# Patient Record
Sex: Male | Born: 1970 | Race: White | Hispanic: No | Marital: Single | Smoking: Never smoker
Health system: Southern US, Community
[De-identification: ages and names within clinical notes are randomized; demographics above are authoritative.]

## PROBLEM LIST (undated history)

## (undated) DIAGNOSIS — R569 Unspecified convulsions: Secondary | ICD-10-CM

## (undated) HISTORY — PX: NECK SURGERY: SHX720

## (undated) HISTORY — PX: CHEST SURGERY: SHX595

## (undated) HISTORY — PX: CERVICAL SPINE SURGERY: SHX589

---

## 2001-03-10 ENCOUNTER — Emergency Department (HOSPITAL_COMMUNITY): Admission: EM | Admit: 2001-03-10 | Discharge: 2001-03-10 | Payer: Self-pay | Admitting: Emergency Medicine

## 2004-07-13 ENCOUNTER — Encounter: Admission: RE | Admit: 2004-07-13 | Discharge: 2004-07-13 | Payer: Self-pay | Admitting: Family Medicine

## 2004-11-18 ENCOUNTER — Inpatient Hospital Stay (HOSPITAL_COMMUNITY): Admission: EM | Admit: 2004-11-18 | Discharge: 2004-11-24 | Payer: Self-pay | Admitting: Emergency Medicine

## 2004-11-21 ENCOUNTER — Encounter (INDEPENDENT_AMBULATORY_CARE_PROVIDER_SITE_OTHER): Payer: Self-pay | Admitting: Cardiology

## 2004-11-22 ENCOUNTER — Encounter (INDEPENDENT_AMBULATORY_CARE_PROVIDER_SITE_OTHER): Payer: Self-pay | Admitting: *Deleted

## 2006-02-06 ENCOUNTER — Ambulatory Visit: Payer: Self-pay | Admitting: Oncology

## 2006-02-06 LAB — CBC WITH DIFFERENTIAL/PLATELET
Basophils Absolute: 0 10*3/uL (ref 0.0–0.1)
HCT: 30.5 % — ABNORMAL LOW (ref 38.7–49.9)
HGB: 9.4 g/dL — ABNORMAL LOW (ref 13.0–17.1)
LYMPH%: 14.1 % (ref 14.0–48.0)
MCH: 22.4 pg — ABNORMAL LOW (ref 28.0–33.4)
MCHC: 31 g/dL — ABNORMAL LOW (ref 32.0–35.9)
MCV: 72.1 fL — ABNORMAL LOW (ref 81.6–98.0)
MONO%: 9.4 % (ref 0.0–13.0)
NEUT%: 74.3 % (ref 40.0–75.0)
lymph#: 0.4 10*3/uL — ABNORMAL LOW (ref 0.9–3.3)

## 2006-02-06 LAB — CHCC SMEAR

## 2006-02-08 LAB — IRON AND TIBC
TIBC: 352 ug/dL (ref 215–435)
UIBC: 331 ug/dL

## 2006-02-08 LAB — FERRITIN: Ferritin: 45 ng/mL (ref 22–322)

## 2006-02-15 LAB — HEMOGLOBINOPATHY EVALUATION: Hgb S Quant: 0 % (ref 0.0–0.0)

## 2006-04-11 ENCOUNTER — Ambulatory Visit: Payer: Self-pay | Admitting: Oncology

## 2006-07-06 ENCOUNTER — Ambulatory Visit: Payer: Self-pay | Admitting: Oncology

## 2006-07-16 ENCOUNTER — Ambulatory Visit (HOSPITAL_COMMUNITY): Admission: RE | Admit: 2006-07-16 | Discharge: 2006-07-16 | Payer: Self-pay | Admitting: Family Medicine

## 2006-07-26 LAB — IRON AND TIBC
%SAT: 16 % — ABNORMAL LOW (ref 20–55)
Iron: 51 ug/dL (ref 42–165)
TIBC: 313 ug/dL (ref 215–435)
UIBC: 262 ug/dL

## 2006-07-26 LAB — CBC WITH DIFFERENTIAL/PLATELET
BASO%: 1.1 % (ref 0.0–2.0)
Basophils Absolute: 0 10*3/uL (ref 0.0–0.1)
EOS%: 0.9 % (ref 0.0–7.0)
Eosinophils Absolute: 0 10*3/uL (ref 0.0–0.5)
HCT: 28.1 % — ABNORMAL LOW (ref 38.7–49.9)
HGB: 8.9 g/dL — ABNORMAL LOW (ref 13.0–17.1)
LYMPH%: 16.4 % (ref 14.0–48.0)
MCH: 23.7 pg — ABNORMAL LOW (ref 28.0–33.4)
MCHC: 31.5 g/dL — ABNORMAL LOW (ref 32.0–35.9)
MCV: 75.3 fL — ABNORMAL LOW (ref 81.6–98.0)
MONO#: 0.3 10*3/uL (ref 0.1–0.9)
MONO%: 8.4 % (ref 0.0–13.0)
NEUT#: 2.5 10*3/uL (ref 1.5–6.5)
NEUT%: 73.2 % (ref 40.0–75.0)
Platelets: 381 10*3/uL (ref 145–400)
RBC: 3.73 10*6/uL — ABNORMAL LOW (ref 4.20–5.71)
RDW: 18 % — ABNORMAL HIGH (ref 11.2–14.6)
WBC: 3.5 10*3/uL — ABNORMAL LOW (ref 4.0–10.0)
lymph#: 0.6 10*3/uL — ABNORMAL LOW (ref 0.9–3.3)

## 2006-07-26 LAB — FERRITIN: Ferritin: 104 ng/mL (ref 22–322)

## 2006-07-26 LAB — COMPREHENSIVE METABOLIC PANEL
ALT: 12 U/L (ref 0–53)
AST: 14 U/L (ref 0–37)
Albumin: 3.9 g/dL (ref 3.5–5.2)
Alkaline Phosphatase: 42 U/L (ref 39–117)
BUN: 9 mg/dL (ref 6–23)
CO2: 28 mEq/L (ref 19–32)
Calcium: 8.5 mg/dL (ref 8.4–10.5)
Chloride: 106 mEq/L (ref 96–112)
Creatinine, Ser: 0.83 mg/dL (ref 0.40–1.50)
Glucose, Bld: 102 mg/dL — ABNORMAL HIGH (ref 70–99)
Potassium: 4.4 mEq/L (ref 3.5–5.3)
Sodium: 141 mEq/L (ref 135–145)
Total Bilirubin: 0.3 mg/dL (ref 0.3–1.2)
Total Protein: 5.6 g/dL — ABNORMAL LOW (ref 6.0–8.3)

## 2006-07-26 LAB — HAPTOGLOBIN: Haptoglobin: 59 mg/dL (ref 16–200)

## 2006-07-26 LAB — RETICULOCYTES
IRF: 0.48 — ABNORMAL HIGH (ref 0.070–0.380)
RETIC #: 155.5 10*3/uL — ABNORMAL HIGH (ref 31.8–103.9)

## 2006-07-26 LAB — LACTATE DEHYDROGENASE: LDH: 116 U/L (ref 94–250)

## 2006-09-13 ENCOUNTER — Ambulatory Visit: Payer: Self-pay | Admitting: Oncology

## 2006-09-18 LAB — COMPREHENSIVE METABOLIC PANEL
ALT: 12 U/L (ref 0–53)
AST: 14 U/L (ref 0–37)
Albumin: 4.6 g/dL (ref 3.5–5.2)
Alkaline Phosphatase: 52 U/L (ref 39–117)
BUN: 9 mg/dL (ref 6–23)
Creatinine, Ser: 0.84 mg/dL (ref 0.40–1.50)
Potassium: 4.1 mEq/L (ref 3.5–5.3)

## 2006-09-18 LAB — CBC WITH DIFFERENTIAL/PLATELET
BASO%: 0.5 % (ref 0.0–2.0)
EOS%: 0.6 % (ref 0.0–7.0)
HCT: 32.4 % — ABNORMAL LOW (ref 38.7–49.9)
MCH: 23.9 pg — ABNORMAL LOW (ref 28.0–33.4)
MCHC: 31.6 g/dL — ABNORMAL LOW (ref 32.0–35.9)
NEUT%: 77.1 % — ABNORMAL HIGH (ref 40.0–75.0)
RBC: 4.29 10*6/uL (ref 4.20–5.71)
WBC: 4.2 10*3/uL (ref 4.0–10.0)
lymph#: 0.6 10*3/uL — ABNORMAL LOW (ref 0.9–3.3)

## 2006-09-18 LAB — CHCC SMEAR

## 2006-11-19 ENCOUNTER — Ambulatory Visit: Payer: Self-pay | Admitting: Oncology

## 2006-11-21 LAB — CBC WITH DIFFERENTIAL/PLATELET
Eosinophils Absolute: 0 10*3/uL (ref 0.0–0.5)
HCT: 28.9 % — ABNORMAL LOW (ref 38.7–49.9)
LYMPH%: 15.2 % (ref 14.0–48.0)
MONO#: 0.3 10*3/uL (ref 0.1–0.9)
NEUT#: 2.5 10*3/uL (ref 1.5–6.5)
NEUT%: 75.2 % — ABNORMAL HIGH (ref 40.0–75.0)
Platelets: 397 10*3/uL (ref 145–400)
WBC: 3.3 10*3/uL — ABNORMAL LOW (ref 4.0–10.0)

## 2006-11-23 LAB — COMPREHENSIVE METABOLIC PANEL
ALT: 12 U/L (ref 0–53)
AST: 13 U/L (ref 0–37)
BUN: 10 mg/dL (ref 6–23)
Creatinine, Ser: 0.93 mg/dL (ref 0.40–1.50)
Total Bilirubin: 0.4 mg/dL (ref 0.3–1.2)

## 2006-11-23 LAB — HEMOGLOBINOPATHY EVALUATION
Hgb A2 Quant: 2.9 % (ref 2.2–3.2)
Hgb F Quant: 0 % (ref 0.0–2.0)
Hgb S Quant: 0 % (ref 0.0–0.0)

## 2006-11-23 LAB — FERRITIN: Ferritin: 236 ng/mL (ref 22–322)

## 2007-03-04 ENCOUNTER — Ambulatory Visit: Payer: Self-pay | Admitting: Oncology

## 2007-07-26 ENCOUNTER — Ambulatory Visit (HOSPITAL_COMMUNITY): Admission: RE | Admit: 2007-07-26 | Discharge: 2007-07-26 | Payer: Self-pay | Admitting: General Surgery

## 2007-08-01 ENCOUNTER — Encounter (HOSPITAL_COMMUNITY): Admission: RE | Admit: 2007-08-01 | Discharge: 2007-08-05 | Payer: Self-pay | Admitting: General Surgery

## 2007-08-05 ENCOUNTER — Ambulatory Visit (HOSPITAL_COMMUNITY): Admission: RE | Admit: 2007-08-05 | Discharge: 2007-08-05 | Payer: Self-pay | Admitting: General Surgery

## 2007-08-05 ENCOUNTER — Encounter (HOSPITAL_BASED_OUTPATIENT_CLINIC_OR_DEPARTMENT_OTHER): Payer: Self-pay | Admitting: General Surgery

## 2010-08-02 ENCOUNTER — Other Ambulatory Visit: Payer: Self-pay | Admitting: Family Medicine

## 2010-08-02 ENCOUNTER — Ambulatory Visit
Admission: RE | Admit: 2010-08-02 | Discharge: 2010-08-02 | Disposition: A | Discharge status: Home / Self Care | Payer: PRIVATE HEALTH INSURANCE | Source: Ambulatory Visit | Attending: Family Medicine | Admitting: Family Medicine

## 2010-08-02 DIAGNOSIS — M1250 Traumatic arthropathy, unspecified site: Secondary | ICD-10-CM

## 2010-10-31 ENCOUNTER — Encounter (HOSPITAL_COMMUNITY)
Admission: RE | Admit: 2010-10-31 | Discharge: 2010-10-31 | Disposition: A | Discharge status: Home / Self Care | Payer: PRIVATE HEALTH INSURANCE | Source: Ambulatory Visit | Attending: Neurosurgery | Admitting: Neurosurgery

## 2010-10-31 LAB — BASIC METABOLIC PANEL
BUN: 10 mg/dL (ref 6–23)
CO2: 33 mEq/L — ABNORMAL HIGH (ref 19–32)
Calcium: 9.8 mg/dL (ref 8.4–10.5)
Chloride: 100 mEq/L (ref 96–112)
Creatinine, Ser: 0.77 mg/dL (ref 0.4–1.5)
GFR calc Af Amer: >60 mL/min (ref >60)

## 2010-10-31 LAB — SURGICAL PCR SCREEN
MRSA, PCR: NEGATIVE
Staphylococcus aureus: NEGATIVE

## 2010-10-31 LAB — CBC
MCH: 30.3 pg (ref 26.0–34.0)
MCHC: 35.9 g/dL (ref 30.0–36.0)
MCV: 84.4 fL (ref 78.0–100.0)
Platelets: 218 10*3/uL (ref 150–400)

## 2010-11-01 NOTE — Op Note (Signed)
NAME:  [name] [name], [name] [name]               ACCOUNT NO.:  [id number]   MEDICAL RECORD NO.:  [id number]          PATIENT TYPE:  AMB   LOCATION:  SDS                          FACILITY:  [facility]   PHYSICIAN:  Leonie Man, M.D.   DATE OF BIRTH:  04-16-1971   DATE OF PROCEDURE:  08/05/2007  DATE OF DISCHARGE:  08/05/2007                               OPERATIVE REPORT   PREOPERATIVE DIAGNOSIS:  Hemorrhoidal disease.   POSTOPERATIVE DIAGNOSIS:  Hemorrhoidal disease.   PROCEDURE:  Hemorrhoidectomy.   SURGEON:  Leonie Man, M.D.   ASSISTANT:  OR tech.   ANESTHESIA:  General.   [name] is a 40 year old man with persistent bleeding and painful  hemorrhoidal disease both with internal and external hemorrhoids.  He  has been treated the past with injection therapy without much effect and  currently none of the over-the-counter medications has continued to help  him.  On evaluation there are no fissures or fistulas noted.  He comes  to the operating room now for hemorrhoidectomy after risks and potential  benefits of surgery had been discussed.  All questions answered and  consent obtained.   PROCEDURE:  The patient is positioned in the prone position on the  operating room table and the buttocks spread apart and held with cloth  tape.  The perianal region is prepped and draped to be included in a  sterile operative field.  The anal verge is dilated up to three  fingerbreadths after which the operating speculum was placed within the  anus.  On the left lateral aspect there is a very large hemorrhoid  hemorrhoidal complex which is infiltrated with 0.5% Marcaine with  epinephrine.  The entire hemorrhoidal complex was dissected off of the  underlying sphincter muscles using electrocautery and maintaining  hemostasis throughout the entire course of dissection.  The defect is  then closed with running suture of 2-0 chromic catgut suture.  Similarly, on the right side the right anterior  hemorrhoidal complex was  similarly treated by cautery excision off of the sphincter muscles and  closure with a running 2-0 chromic catgut.  A solitary midline  hemorrhoid was then suture ligated with 3-0 chromic.  The hemorrhoid was  then amputated.  A small skin tag on the anterior aspect of the anus was  excised.  All areas of dissection then checked for hemostasis and noted  to be dry.  I placed Marcaine soaked Gelfoam sponges over all of the  incisions and placed a sterile dressing over the anus.  The anesthetic  was reversed and the patient removed from the operating room to the  recovery room in stable condition.  He tolerated the procedure well.      Leonie Man, M.D.  Electronically Signed    PB/MEDQ  D:  08/05/2007  T:  08/06/2007  Job:  [id number]

## 2010-11-04 NOTE — H&P (Signed)
NAME:  [name] [name], [name] [name]               ACCOUNT NO.:  [id number]   MEDICAL RECORD NO.:  [id number]          PATIENT TYPE:  INP   LOCATION:  1823                         FACILITY:  [facility]   PHYSICIAN:  Jonna L. Robb Matar, M.D.DATE OF BIRTH:  [birth year]   DATE OF ADMISSION:  11/18/2004  DATE OF DISCHARGE:                                HISTORY & PHYSICAL   PRIMARY CARE PHYSICIAN:  Renaye Rakers, M.D.   CHIEF COMPLAINT:  Abnormal lab test.   This is a 40 year old Caucasian male who has noticed he has been tired for a  couple of weeks but surprisingly has not really been dyspneic on exertion,  not orthostatic, has had no peripheral edema, no chest pain.  He came in for  a physical and was found to be extremely anemic and so is being admitted for  a workup.   PAST MEDICAL HISTORY:  Epilepsy.  He has had this for a number of years.  The last seizure he had was two years ago.  He has been on Tegretol for at  least ten years as well as Neurontin.  He is followed by [facility].   OPERATIONS:  Surgery at age 59 to fix a pectus excavatum.   FAMILY HISTORY:  Thyroid disease.  His father had a MI.  His mother had  anemia.   REVIEW OF SYSTEMS:  He has had a hemorrhoid, otherwise as noted.  All other  review of systems were asked and were negative.   PHYSICAL EXAMINATION:  VITAL SIGNS:  Blood pressure is 134/70, pulse is 102  lying down, respirations are 18.  He is afebrile.  SKIN:  Very pale.  Skin shows no rash, lesions, or nodules.  HEENT:  Conjunctivae and lids are otherwise normal.  Pupils reactive.  Extraocular movements are full.  He has normal hearing.  Mucosa and pharynx:  There is no thyromegaly or carotid bruits.  LUNGS:  Respiratory effort is normal.  The lungs are clear to A&P without  wheezing, rales, rhonchi, or dullness.  HEART:  Slightly tachycardic.  Normal S1 and S2.  There is a 2/6  holosystolic murmur in the left sternal border.  Pulses show no bruits.  There is no  cyanosis, clubbing, or edema.  ABDOMEN:  Nontender.  Normal bowel sounds.  No hepatosplenomegaly.  No  hernia.  No adenopathy.  MUSCULOSKELETAL:  Muscle strength is 5/5 with a full range of motion all  four extremities.  NEUROLOGIC:  Cranial nerves are intact.  DTRs are 2+.  Station is normal.  He is alert and oriented x 3.  Normal memory, judgment, and affect.   LABORATORY DATA:  Available at present shows a hemoglobin of 4.3, white  count is 4.0, platelet count 465.  His CMP is negative.  His TSH is 3.55.   IMPRESSION:  1.  Severe anemia.  Differential diagnosis is wide open.  He could have some      B-12 or folate deficiency.  He has a family history of thyroid problems.      His Tegretol especially has been known to interfere with bone marrow in  multiple areas and has been associated with aplastic anemia.  He could      have an occult gastrointestinal bleed.  We are going to do an extensive      workup, transfuse him probably about 5-6 units, and if there is no      obvious source, we will consult with hematology to get bone marrow      biopsy.  2.  Epilepsy.  For the present, I will leave him on his Tegretol and      Neurontin.  Obviously if Tegretol is implicated in his anemia, he will      need to find an alternative anticonvulsant.  3.  Systolic murmur.  I am going to get an echocardiogram on him.      Apparently, his Mom states he has had this for a number of years but she      does not remember if it has ever evaluated.  It could just be really      loud at the moment because he is so extremely anemic but we will      evaluate that as well.      JLB/MEDQ  D:  11/18/2004  T:  11/19/2004  Job:  [id number]

## 2010-11-04 NOTE — Op Note (Signed)
NAME:  [name] [name], BRILL NO.:  [id number]   MEDICAL RECORD NO.:  [id number]          PATIENT TYPE:  INP   LOCATION:  3016                         FACILITY:  [facility]   PHYSICIAN:  John C. Madilyn Fireman, M.D.    DATE OF BIRTH:  [birth year]   DATE OF PROCEDURE:  11/22/2004  DATE OF DISCHARGE:                                 OPERATIVE REPORT   INDICATIONS FOR PROCEDURE:  Severe anemia with hemoglobin less than 5.0,  with severe microcytosis and documented iron deficiency.   PROCEDURE:  The patient was placed in the left lateral decubitus position  and placed on the pulse monitor, with continuous low-flow oxygen delivered  by nasal cannula. He was sedated with 75 mcg IV fentanyl and 5 mg IV Versed.  The Olympus video endoscope was advanced under direct vision into the  oropharynx and esophagus.  The esophagus was straight and of normal caliber,  with squamocolumnar line at  38 cm.  There was no visible hiatal hernia  ring, stricture or other abnormalities at the GE junction. Stomach was  entered and a small amount of liquid secretions were suctioned from the  fundus. Retroflexed view of the cardia was unremarkable.  The fundus, body,  antrum and pylorus all appeared normal. The duodenum was entered and both  the bulb and second portion were well  inspected and appeared to be within  normal limits. The scope was advanced as far as possible down the distal  duodenum,  and biopsies were taken to rule out celiac disease.  The  scope  was then withdrawn and the patient prepared for colonoscopy.  He tolerated  the procedure well. There were no immediate complications.   IMPRESSION:  Normal study.   PLAN:  Proceed to colonoscopy and await small bowel biopsies.      JCH/MEDQ  D:  11/22/2004  T:  11/22/2004  Job:  [id number]   cc:   Renaye Rakers, M.D.  [phone]  [place]  [place] [address]  Fax: [phone]

## 2010-11-04 NOTE — Consult Note (Signed)
NAME:  [name] [name], GILL NO.:  [id number]   MEDICAL RECORD NO.:  [id number]          PATIENT TYPE:  INP   LOCATION:  3016                         FACILITY:  [facility]   PHYSICIAN:  John C. Madilyn Fireman, M.D.    DATE OF BIRTH:  [birth year]   DATE OF CONSULTATION:  11/20/2004  DATE OF DISCHARGE:                                   CONSULTATION   REASON FOR CONSULTATION:  Severe anemia.   HISTORY OF PRESENT ILLNESS:  The patient is a 40 year old white male with a  history of epilepsy who presented with weakness and fatigue and was found to  have a hemoglobin of 4.0 with a  hematocrit of 17.3, platelet count of  473,000, white blood cell count  4300 with normal BUN and an MCV of less  than 60.  He does when questioned, admit some intermittent rectal bleeding  but has not told his parents this. It sounds more like a degree of bleeding  consistent with hemorrhoids by his description.  He denies any nausea,  vomiting, anorexia, diarrhea, significant abdominal pain or weight loss.  He  has never been known to be anemic in the past.   PAST MEDICAL HISTORY:  Seizure disorder.   PAST SURGICAL HISTORY:  Surgery to repair pectus excavatum at age 60.   FAMILY HISTORY:  Father had a myocardial infarction.  No family history of  GI malignancy or hematologic disorders.   PHYSICAL EXAMINATION:  GENERAL:  Well-developed, well-nourished white male  in no acute distress, quite pale despite transfusion of four units packed  red blood cells.  HEENT:  Unremarkable.  HEART:  Regular rate and rhythm, without murmur.  LUNGS:  Clear.  ABDOMEN:  Soft, nondistended with normoactive bowel sounds.  No  hepatosplenomegaly, masses, guarding.   IMPRESSION:  Severe iron deficiency anemia.  No documentation of hemoccult  status yet but would assume occult gastrointestinal blood loss until  evidence otherwise.   PLAN:  Patient is scheduled for colonoscopy and esophagogastroduodenoscopy  with biopsy which  will be done in the next two days.      JCH/MEDQ  D:  11/20/2004  T:  11/21/2004  Job:  [id number]   cc:   Renaye Rakers, M.D.  [phone] [address], [address]  [place]  [place] [address]  Fax: [phone]

## 2010-11-04 NOTE — Discharge Summary (Signed)
[name], [name] NO.:  [id number]   MEDICAL RECORD NO.:  [id number]          PATIENT TYPE:  INP   LOCATION:  3016                         FACILITY:  [facility]   PHYSICIAN:  Gertha Calkin, M.D.DATE OF BIRTH:  Jul 03, 1970   DATE OF ADMISSION:  11/18/2004  DATE OF DISCHARGE:  11/24/2004                                 DISCHARGE SUMMARY   PRIMARY CARE PHYSICIAN:  Renaye Rakers, M.D.   CONSULTATIONS:  Petra Kuba, M.D.   DISCHARGE DIAGNOSES:  1.  Microcytic anemia.  2.  Seizure disorder.   DISCHARGE MEDICATIONS:  Patient is to resume home doses of the following:  1.  Neurontin 800 mg p.o. t.i.d.  2.  Tegretol 600 mg p.o. b.i.d.  New medications:  1.  Ferrous sulfate 325 mg p.o. daily.   DIAGNOSTIC STUDIES:  1.  Esophagogastroduodenoscopy and colonoscopy negative. (See consultative      operative notes for details.)  2.  Duodenal mucosal biopsy negative pathology for findings consistent with      dysplasia, metaplasia or villous atrophy (not consistent with celiac      Spr[place]).   HOSPITAL COURSE:  Please see history and physical for details of admission.   PROBLEM #1:  MICROCYTIC ANEMIA:  The patient had evaluation by  gastroenterology, has had intermittent Heme positive stools and microcytic  anemia.  No gross gastrointestinal bleed or findings consistent with  gastrointestinal blood loss was elicited through the interventional  procedures.  Biopsy results as above.  Patient still has intermittent Heme  positive stools on discharge, however, is hemodynamically stable and  hemoglobin has been stable after initial 4 unit transfusions from 6 to  approximately in the 9.0 range.  The day of discharge the hemoglobin is 9.4.  MCV throughout has been in the 60's  Hemoglobin electrophoresis has been  drawn and sent, however those results are pending at the time of discharge.  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  At this time follow up  with primary care physician needs to determine whether he has a genetic  disorder that would explain his microcytic anemia.  He has been empirically  started on iron supplementation therapy.  Note:  Reticulocyte count obtained  during this hospitalization showed a value of 1.4. This was within the  normal range (however, this is not to be expected if he did have normal bone  marrow function).  Would repeat retic count since he is on iron therapy now  to determine whether supplementation has enabled his bone marrow to produce  erythrocytes.  If electrophoresis does not provide the answer, would  determine whether a repeat retic count showed normal bone marrow function.  Since he is stable, I am sending him home with careful instructions to  return to the emergency department if he has sudden onset of light  headedness or worsening gross bloody stools.  Otherwise, if abnormal bone  marrow function is the issue  would obtain a hematology consult in the  outpatient setting.   PROBLEM #2:  SEIZURE DISORDER:  No events during this hospitalization.  As  noted above, did not hold his Tegretol and continue this at discharge.  There may be a consideration as a maneuver to hold his Tegretol if he can be  controlled on an alternative seizure medication.   DISPOSITION:  Patient is stable on discharge home with normal vital signs,  normal labs, stable hemoglobin as noted above.   FOLLOW UP:  Follow up with Dr. Parke Simmers.  He is instructed to make an  appointment early next week between Monday and Wednesday to follow up  hemoglobin electrophoresis and possibly getting a repeat reticulocyte count  as he has now been started on iron replacement therapy.  It should be noted  that it may be prudent to refer him to outpatient hematology to determine  the underlying cause  of his microcytic anemia as clinical .       JD/MEDQ  D:  11/24/2004  T:  11/24/2004  Job:  [id number]   cc:   Renaye Rakers, M.D.  [phone], [address]  [place]  [place] [address]  Fax: [phone]   Petra Kuba, M.D.  [address], [address]  [place]  [place] [address]  Fax: [phone]

## 2010-11-04 NOTE — Op Note (Signed)
NAME:  [name] [name], [name] [name] NO.:  [id number]   MEDICAL RECORD NO.:  [id number]          PATIENT TYPE:  INP   LOCATION:  3016                         FACILITY:  [facility]   PHYSICIAN:  John C. Madilyn Fireman, M.D.    DATE OF BIRTH:  05/07/1971   DATE OF PROCEDURE:  11/22/2004  DATE OF DISCHARGE:                                 OPERATIVE REPORT   PROCEDURE:  Colonoscopy.   INDICATIONS FOR PROCEDURE:  Severe microcytic anemia.   DESCRIPTION OF PROCEDURE:  The patient was placed in the left lateral  decubitus position and placed on pulse monitor, with continuous low-flow  oxygen delivered by nasal cannula. He was sedated with 25 mcg IV fentanyl  and 2.5 mg IV Versed, in addition to that given in the previous EGD. The  Olympus video colonoscope was inserted into the rectum and advanced to the  cecum, confirmed by transillumination of McBurney's point and visualization  of ileocecal valve and appendiceal orifice. The prep was excellent. The  cecum, ascending, transverse, descending and sigmoid colon all appeared  normal; with no masses, polyps, diverticula or other mucosal abnormalities,  The rectum likewise appeared normal, and retroflexed view of the anus  revealed no obvious internal hemorrhoids. The scope was then withdrawn and  the patient returned to the recovery room in stable condition. He tolerated  the procedure well. There were no immediate complications.   IMPRESSION:  Otherwise normal colonoscopy.   PLAN:  Await small bowel biopsies.   RESULTS:  Negative with probably hematology consult.      JCH/MEDQ  D:  11/22/2004  T:  11/22/2004  Job:  [id number]   cc:   Renaye Rakers, M.D.  [phone] [address]  [place]  [place] [address]  Fax: [phone]

## 2010-11-08 ENCOUNTER — Observation Stay (HOSPITAL_COMMUNITY): Payer: PRIVATE HEALTH INSURANCE

## 2010-11-08 ENCOUNTER — Observation Stay (HOSPITAL_COMMUNITY)
Admission: RE | Admit: 2010-11-08 | Discharge: 2010-11-09 | Disposition: A | Discharge status: Home / Self Care | Payer: PRIVATE HEALTH INSURANCE | Source: Ambulatory Visit | Attending: Neurosurgery | Admitting: Neurosurgery

## 2010-11-08 DIAGNOSIS — M4712 Other spondylosis with myelopathy, cervical region: Secondary | ICD-10-CM | POA: Insufficient documentation

## 2010-11-08 DIAGNOSIS — Z79899 Other long term (current) drug therapy: Secondary | ICD-10-CM | POA: Insufficient documentation

## 2010-11-08 DIAGNOSIS — G40802 Other epilepsy, not intractable, without status epilepticus: Secondary | ICD-10-CM | POA: Insufficient documentation

## 2010-11-08 DIAGNOSIS — M5000 Cervical disc disorder with myelopathy, unspecified cervical region: Principal | ICD-10-CM | POA: Insufficient documentation

## 2010-11-11 NOTE — Op Note (Signed)
NAME:  [name] [name], [name] [name] NO.:  [id number]  MEDICAL RECORD NO.:  [id number]           PATIENT TYPE:  O  LOCATION:  3533                         FACILITY:  [facility]  PHYSICIAN:  Danae Orleans. Venetia Maxon, M.D.  DATE OF BIRTH:  19-Oct-1970  DATE OF PROCEDURE:  11/08/2010 DATE OF DISCHARGE:                              OPERATIVE REPORT   PREOPERATIVE DIAGNOSES:  Herniated cervical disk with spondylosis, stenosis, myelopathy with cervical radiculopathy, C5-C6 and C6-C7 levels.  POSTOPERATIVE DIAGNOSES:  Herniated cervical disk with spondylosis, stenosis, myelopathy with cervical radiculopathy, C5-C6 and C6-C7 levels.  PROCEDURE:  Anterior cervical decompression and fusion, C5-C6 and C6-C7 levels with PEEK interbody cages with morselized bone autograft and allograft, PureGen, and anterior cervical plate.  SURGEON:  Danae Orleans. Venetia Maxon, MD  ASSISTANT:  Clydene Fake, MD  ANESTHESIA:  General endotracheal anesthesia.  ESTIMATED BLOOD LOSS:  Minimal.  COMPLICATIONS:  None.  DISPOSITION:  Recovery.  INDICATIONS:  [name] [name] is a 40 year old young man with seizure disorder and left arm weakness and myelopathy with severe spinal cord compression and canal stenosis at C5-C6 and C6-C7 levels.  It was elected take him to surgery for anterior cervical decompression and fusion at these affected levels.  PROCEDURE IN DETAIL:  [name] [name] was brought to the operating room. Following satisfactory and uncomplicated induction of general endotracheal anesthesia and placement of intravenous lines, he was placed in supine position on the operating table.  His neck was maintained in neutral alignment and the anterior neck was then prepped and draped in the usual sterile fashion.  The area of planned incision was infiltrated with local lidocaine.  Incision was made from midline to the anterior border of sternocleidomastoid muscle in one of the middle neck creases and carried through  platysma layer.  Subplatysmal dissection was performed exposing the anterior cervical spine, keeping the carotid sheath lateral, trachea and esophagus kept medial.  Bent spinal needles were placed what were felt to be the C5-C6 and C6-C7 levels and this was confirmed on intraoperative x-ray.  Longus colli muscles were then taken down from the anterior cervical spine with the electrocautery.  Key elevator and self-retaining Shadow-Line retractor were placed to facilitate exposure.  The interspaces at C5-C6 and C6-C7 were then incised.  Disk material was removed in piecemeal fashion. Distraction pins were placed at C5 and C6 and using gentle distraction the interspace was opened.  The endplates were eburnated with high-speed drill and uncinate spurs and central spurs were also drilled down with a 2-mm matchstick drill.  The central and left-sided disk herniation was then removed.  The spinal cord dura was decompressed.  There was a calcified ligamentous tissue which appeared to be on its way to densely adhering to the spinal cord dura which was carefully elevated and then removed.  Spinal cord dura and nerve roots were well decompressed. Hemostasis was assured.  Attention was then turned to the C6-C7 level where distraction pin was placed at C7 and using gentle interspace distraction again the interspace was opened.  The herniated disk material was removed and both C7 nerve roots were widely decompressed and extended  out to the neural foramina and hemostasis was assured.  The central spinal cord dura was also decompressed.  After trial sizing, it was elected to use a 5-mm PEEK interbody cage which was packed with PureGen soaked Profuse blocks and autograft removed from the bone spurs and drilling of the endplates.  The implant was then tamped into position and countersunk appropriately.  Distraction pin was removed at C7 and the screw hole was packed with Gelfoam.  A similarly sized implant  was placed at C5-C6 and this was tamped into position.  The distraction pins were removed, sites were packed, and subsequently a 32- mm Trestle anterior cervical plate was affixed to the anterior cervical spine after removing traction weight with 14-mm variable angle screws two at C5, two at C6, and two at C7, all screws had excellent purchase. Locking mechanisms were engaged.  Wound was irrigated.  Soft tissues were inspected and found to be in good repair.  Hemostasis was assured. Lateral radiograph demonstrated well-positioned interbody grafts and anterior cervical plate without any complicating features.  The platysma layer was then closed with 3-0 Vicryl sutures.  Skin edges were approximated with 3-0 Vicryl subcuticular stitch.  Wound was dressed with Dermabond.  The patient was extubated in the operating room and taken to recovery in stable and satisfactory condition having tolerated the operation well.  Counts were correct at the end of the case.     Danae Orleans. Venetia Maxon, M.D.     JDS/MEDQ  D:  11/08/2010  T:  11/09/2010  Job:  [id number]  Electronically Signed by Maeola Harman M.D. on 11/11/2010 09:19:23 AM

## 2011-03-10 LAB — COMPREHENSIVE METABOLIC PANEL
Albumin: 4
Alkaline Phosphatase: 52
BUN: 7
CO2: 30
Chloride: 105
Glucose, Bld: 104 — ABNORMAL HIGH
Potassium: 4.8
Total Bilirubin: 0.3

## 2011-03-10 LAB — CBC
HCT: 31.7 — ABNORMAL LOW
HCT: 27.4 — ABNORMAL LOW
Hemoglobin: 8.6 — ABNORMAL LOW
Platelets: 234
RBC: 4.46
RBC: 4.23
WBC: 4.7
WBC: 5.3

## 2011-03-10 LAB — DIFFERENTIAL
Basophils Absolute: 0
Basophils Relative: 1
Eosinophils Absolute: 0
Lymphocytes Relative: 12
Monocytes Absolute: 0.5
Monocytes Relative: 11
Neutrophils Relative %: 82 — ABNORMAL HIGH

## 2011-03-10 LAB — CROSSMATCH
ABO/RH(D): A POS
Antibody Screen: NEGATIVE

## 2011-04-13 ENCOUNTER — Emergency Department (HOSPITAL_COMMUNITY)
Admission: EM | Admit: 2011-04-13 | Discharge: 2011-04-13 | Disposition: A | Discharge status: Home / Self Care | Payer: Worker's Compensation | Attending: Emergency Medicine | Admitting: Emergency Medicine

## 2011-04-13 DIAGNOSIS — Z79899 Other long term (current) drug therapy: Secondary | ICD-10-CM | POA: Insufficient documentation

## 2011-04-13 DIAGNOSIS — W268XXA Contact with other sharp object(s), not elsewhere classified, initial encounter: Secondary | ICD-10-CM | POA: Insufficient documentation

## 2011-04-13 DIAGNOSIS — G40909 Epilepsy, unspecified, not intractable, without status epilepticus: Secondary | ICD-10-CM | POA: Insufficient documentation

## 2011-04-13 DIAGNOSIS — Y990 Civilian activity done for income or pay: Secondary | ICD-10-CM | POA: Insufficient documentation

## 2011-04-13 DIAGNOSIS — S01409A Unspecified open wound of unspecified cheek and temporomandibular area, initial encounter: Secondary | ICD-10-CM | POA: Insufficient documentation

## 2011-04-18 ENCOUNTER — Emergency Department (HOSPITAL_COMMUNITY)
Admission: EM | Admit: 2011-04-18 | Discharge: 2011-04-18 | Disposition: A | Discharge status: Home / Self Care | Payer: PRIVATE HEALTH INSURANCE | Attending: Emergency Medicine | Admitting: Emergency Medicine

## 2011-04-18 DIAGNOSIS — Z09 Encounter for follow-up examination after completed treatment for conditions other than malignant neoplasm: Secondary | ICD-10-CM | POA: Insufficient documentation

## 2012-09-14 ENCOUNTER — Emergency Department (HOSPITAL_COMMUNITY)
Admission: EM | Admit: 2012-09-14 | Discharge: 2012-09-14 | Disposition: A | Discharge status: Home / Self Care | Payer: PRIVATE HEALTH INSURANCE | Attending: Emergency Medicine | Admitting: Emergency Medicine

## 2012-09-14 ENCOUNTER — Encounter (HOSPITAL_COMMUNITY): Payer: Self-pay | Admitting: Emergency Medicine

## 2012-09-14 DIAGNOSIS — R197 Diarrhea, unspecified: Secondary | ICD-10-CM | POA: Insufficient documentation

## 2012-09-14 DIAGNOSIS — A084 Viral intestinal infection, unspecified: Secondary | ICD-10-CM

## 2012-09-14 DIAGNOSIS — R509 Fever, unspecified: Secondary | ICD-10-CM | POA: Insufficient documentation

## 2012-09-14 DIAGNOSIS — G40909 Epilepsy, unspecified, not intractable, without status epilepticus: Secondary | ICD-10-CM | POA: Insufficient documentation

## 2012-09-14 DIAGNOSIS — R1084 Generalized abdominal pain: Secondary | ICD-10-CM | POA: Insufficient documentation

## 2012-09-14 DIAGNOSIS — A088 Other specified intestinal infections: Secondary | ICD-10-CM | POA: Insufficient documentation

## 2012-09-14 DIAGNOSIS — Z79899 Other long term (current) drug therapy: Secondary | ICD-10-CM | POA: Insufficient documentation

## 2012-09-14 HISTORY — DX: Unspecified convulsions: R56.9

## 2012-09-14 LAB — CBC WITH DIFFERENTIAL/PLATELET
Basophils Absolute: 0 10*3/uL (ref 0.0–0.1)
Basophils Relative: 0 % (ref 0–1)
Eosinophils Absolute: 0 10*3/uL (ref 0.0–0.7)
Eosinophils Relative: 0 % (ref 0–5)
Lymphocytes Relative: 2 % — ABNORMAL LOW (ref 12–46)
MCV: 81 fL (ref 78.0–100.0)
Platelets: 179 10*3/uL (ref 150–400)
RDW: 12.3 % (ref 11.5–15.5)
WBC: 12.3 10*3/uL — ABNORMAL HIGH (ref 4.0–10.5)

## 2012-09-14 LAB — URINALYSIS, MICROSCOPIC ONLY
Glucose, UA: NEGATIVE mg/dL
Hgb urine dipstick: NEGATIVE
Specific Gravity, Urine: 1.028 (ref 1.005–1.030)
pH: 5 (ref 5.0–8.0)

## 2012-09-14 LAB — COMPREHENSIVE METABOLIC PANEL
ALT: 26 U/L (ref 0–53)
AST: 27 U/L (ref 0–37)
Albumin: 4.6 g/dL (ref 3.5–5.2)
CO2: 26 mEq/L (ref 19–32)
Calcium: 9.4 mg/dL (ref 8.4–10.5)
Sodium: 139 mEq/L (ref 135–145)
Total Protein: 8 g/dL (ref 6.0–8.3)

## 2012-09-14 MED ORDER — SODIUM CHLORIDE 0.9 % IV SOLN
1000.0000 mL | Freq: Once | INTRAVENOUS | Status: AC
  Administered 2012-09-14: 1000 mL via INTRAVENOUS

## 2012-09-14 MED ORDER — MORPHINE SULFATE 4 MG/ML IJ SOLN
4.0000 mg | Freq: Once | INTRAMUSCULAR | Status: AC
  Administered 2012-09-14: 4 mg via INTRAVENOUS
  Filled 2012-09-14: qty 1

## 2012-09-14 MED ORDER — ONDANSETRON HCL 4 MG/2ML IJ SOLN
4.0000 mg | Freq: Once | INTRAMUSCULAR | Status: AC
  Administered 2012-09-14: 4 mg via INTRAVENOUS
  Filled 2012-09-14: qty 2

## 2012-09-14 MED ORDER — HYDROCODONE-ACETAMINOPHEN 5-325 MG PO TABS: 2.0000 | ORAL_TABLET | ORAL | Status: DC | PRN

## 2012-09-14 MED ORDER — ONDANSETRON 4 MG PO TBDP: ORAL_TABLET | ORAL | Status: DC

## 2012-09-14 MED ORDER — ACETAMINOPHEN 325 MG PO TABS
650.0000 mg | ORAL_TABLET | Freq: Once | ORAL | Status: AC
  Administered 2012-09-14: 650 mg via ORAL
  Filled 2012-09-14: qty 2

## 2012-09-14 NOTE — ED Provider Notes (Signed)
History     CSN: [id number]  Arrival date & time 09/14/12  4098   First MD Initiated Contact with Patient 09/14/12 0745      Chief Complaint  Patient presents with  . Nausea  . Emesis  . Diarrhea    (Consider location/radiation/quality/duration/timing/severity/associated sxs/prior treatment) Patient is a 42 y.o. male presenting with vomiting and diarrhea. The history is provided by the patient and medical records. No language interpreter was used.  Emesis Severity:  Moderate Duration:  7 hours Timing:  Intermittent Emesis appearance: NBNB. Progression:  Unchanged Chronicity:  New Recent urination:  Normal Context: not post-tussive and not self-induced   Relieved by:  Nothing Worsened by:  Nothing tried Ineffective treatments:  None tried Associated symptoms: abdominal pain and diarrhea   Diarrhea Associated symptoms: abdominal pain and vomiting   Associated symptoms: no diaphoresis and no fever     [name] [name] is a 42 y.o. male  with a hx of seizure disorder presents to the Emergency Department complaining of gradual, persistent, progressively worsening nausea vomiting and diarrhea onset 1 AM this morning.  Patient states his father, who is 2, has the same symptoms and has been hospitalized with likely the Norovirus.  Patient states symptoms began with nausea, progressed to vomiting and diarrhea and his abdominal pain developed afterwards. He describes his pain as soreness and cramping worse on the left than the right, rated at a 5/10 and nonradiating. Associated symptoms include low-grade fever.  Nothing makes it better and nothing makes it worse.  Pt denies chills, headache, neck pain, chest pain, shortness of breath, dysuria, hematuria, weakness, dizziness, syncope, seizures.     Past Medical History  Diagnosis Date  . Seizures     Past Surgical History  Procedure Laterality Date  . Neck surgery    . Chest surgery      No family history on file.  History   Substance Use Topics  . Smoking status: Never Smoker   . Smokeless tobacco: Not on file  . Alcohol Use: No      Review of Systems  Constitutional: Negative for fever, diaphoresis, appetite change, fatigue and unexpected weight change.  HENT: Negative for mouth sores, trouble swallowing, neck pain and neck stiffness.   Respiratory: Negative for cough, chest tightness, shortness of breath, wheezing and stridor.   Cardiovascular: Negative for chest pain and palpitations.  Gastrointestinal: Positive for nausea, vomiting, abdominal pain and diarrhea. Negative for constipation, blood in stool, abdominal distention and rectal pain.  Genitourinary: Negative for dysuria, urgency, frequency, hematuria, flank pain and difficulty urinating.  Musculoskeletal: Negative for back pain.  Skin: Negative for rash.  Neurological: Negative for weakness.  Hematological: Negative for adenopathy.  Psychiatric/Behavioral: Negative for confusion.  All other systems reviewed and are negative.    Allergies  Review of patient's allergies indicates no known allergies.  Home Medications   Current Outpatient Rx  Name  Route  Sig  Dispense  Refill  . carbamazepine (TEGRETOL XR) 200 MG 12 hr tablet   Oral   Take 200 mg by mouth 2 (two) times daily. Takes with a 400 mg tablet for total of 600 mg         . carbamazepine (TEGRETOL XR) 400 MG 12 hr tablet   Oral   Take 400 mg by mouth 2 (two) times daily. Takes with a 200 mg tablet for total of 600 mg         . gabapentin (NEURONTIN) 800 MG tablet   Oral  Take 800 mg by mouth 3 (three) times daily.         Marland Kitchen HYDROcodone-acetaminophen (NORCO/VICODIN) 5-325 MG per tablet   Oral   Take 2 tablets by mouth every 4 (four) hours as needed for pain.   10 tablet   0   . ondansetron (ZOFRAN ODT) 4 MG disintegrating tablet      4mg  ODT q4 hours prn nausea/vomit   4 tablet   0     BP 123/72  Pulse 91  Temp(Src) 99.3 F (37.4 C) (Oral)  Resp 16   SpO2 99%  Physical Exam  Nursing note and vitals reviewed. Constitutional: He is oriented to person, place, and time. He appears well-developed and well-nourished.  HENT:  Head: Normocephalic and atraumatic.  Mouth/Throat: Oropharynx is clear and moist.  Eyes: Conjunctivae are normal. Pupils are equal, round, and reactive to light. No scleral icterus.  Neck: Normal range of motion.  Cardiovascular: Normal rate, regular rhythm, normal heart sounds and intact distal pulses.  Exam reveals no gallop and no friction rub.   No murmur heard. Pulmonary/Chest: Effort normal and breath sounds normal. No respiratory distress. He has no wheezes. He has no rales.  Abdominal: Soft. Normal appearance and bowel sounds are normal. He exhibits no distension and no mass. There is generalized tenderness (mild). There is no rigidity, no rebound, no guarding, no CVA tenderness, no tenderness at McBurney's point and negative Murphy's sign.  Musculoskeletal: He exhibits no edema and no tenderness.  Lymphadenopathy:    He has no cervical adenopathy.  Neurological: He is alert and oriented to person, place, and time. He exhibits normal muscle tone. Coordination normal.  Skin: Skin is warm and dry. No erythema.  Psychiatric: He has a normal mood and affect.    ED Course  Procedures (including critical care time)  Labs Reviewed  CBC WITH DIFFERENTIAL - Abnormal; Notable for the following:    WBC 12.3 (*)    RBC 6.05 (*)    Hemoglobin 17.7 (*)    MCHC 36.1 (*)    Neutrophils Relative 91 (*)    Neutro Abs 11.1 (*)    Lymphocytes Relative 2 (*)    Lymphs Abs 0.3 (*)    All other components within normal limits  COMPREHENSIVE METABOLIC PANEL - Abnormal; Notable for the following:    Glucose, Bld 175 (*)    All other components within normal limits  LIPASE, BLOOD  URINALYSIS, MICROSCOPIC ONLY   No results found.   1. Viral gastroenteritis       MDM  [name] [name] presents with NVD.  Patient with  symptoms consistent with viral gastroenteritis and he has no sick contacts with the same.  Vitals are stable.  Patient is nontoxic, nonseptic appearing, in no apparent distress.  Patient's pain and other symptoms adequately managed in emergency department.  Fluid bolus given. Patient does not meet the SIRS or Sepsis criteria.  No signs of dehydration, tolerating PO fluids > 6 oz.  Lungs are clear.  On repeat exam patient does not have a surgical abdomin and there are no peritoneal signs.  No focal abdominal pain, no concern for appendicitis, cholecystitis, pancreatitis, ruptured viscus, UTI, kidney stone, or any other abdominal etiology.  Supportive therapy indicated with return if symptoms worsen.  I have also discussed reasons to return immediately to the ER.  Patient expresses understanding and agrees with plan.    Dahlia Client Lebron Nauert, PA-C 09/14/12 1257

## 2012-09-14 NOTE — ED Notes (Signed)
Received pt from home with c/o N/V/D onset 0100 with abdomen/flank pain. Pt has a relative currently admitted to hospital for same.

## 2012-09-16 NOTE — ED Provider Notes (Signed)
Medical screening examination/treatment/procedure(s) were performed by non-physician practitioner and as supervising physician I was immediately available for consultation/collaboration.  Treyvonne Tata T Nafeesa Dils, MD 09/16/12 1956 

## 2012-09-17 ENCOUNTER — Telehealth: Payer: Self-pay | Admitting: *Deleted

## 2012-09-17 NOTE — Telephone Encounter (Signed)
Spoke to patient to reschedule appointment from 10-04-12 to 10-10-12 at 830 am.

## 2012-10-04 ENCOUNTER — Ambulatory Visit: Payer: Self-pay | Admitting: Nurse Practitioner

## 2012-10-10 ENCOUNTER — Encounter: Payer: Self-pay | Admitting: Nurse Practitioner

## 2012-10-10 ENCOUNTER — Ambulatory Visit (INDEPENDENT_AMBULATORY_CARE_PROVIDER_SITE_OTHER): Payer: PRIVATE HEALTH INSURANCE | Admitting: Nurse Practitioner

## 2012-10-10 VITALS — BP 117/66 | HR 70 | Ht 69.75 in | Wt 140.0 lb

## 2012-10-10 DIAGNOSIS — Z5181 Encounter for therapeutic drug level monitoring: Secondary | ICD-10-CM | POA: Insufficient documentation

## 2012-10-10 DIAGNOSIS — G40209 Localization-related (focal) (partial) symptomatic epilepsy and epileptic syndromes with complex partial seizures, not intractable, without status epilepticus: Secondary | ICD-10-CM | POA: Insufficient documentation

## 2012-10-10 DIAGNOSIS — Z79899 Other long term (current) drug therapy: Secondary | ICD-10-CM

## 2012-10-10 DIAGNOSIS — G40309 Generalized idiopathic epilepsy and epileptic syndromes, not intractable, without status epilepticus: Secondary | ICD-10-CM | POA: Insufficient documentation

## 2012-10-10 MED ORDER — CARBAMAZEPINE ER 400 MG PO TB12: 400.0000 mg | ORAL_TABLET | Freq: Two times a day (BID) | ORAL | Status: DC

## 2012-10-10 MED ORDER — GABAPENTIN 800 MG PO TABS: 800.0000 mg | ORAL_TABLET | Freq: Three times a day (TID) | ORAL | Status: DC

## 2012-10-10 MED ORDER — CARBAMAZEPINE ER 200 MG PO TB12: 200.0000 mg | ORAL_TABLET | Freq: Two times a day (BID) | ORAL | Status: DC

## 2012-10-10 NOTE — Patient Instructions (Addendum)
Yearly labs today, CBC, CMP, carbamazepine level Continue current meds, printed prescriptions Followup yearly

## 2012-10-10 NOTE — Progress Notes (Signed)
I have read the note, and I agree with the clinical assessment and plan.  

## 2012-10-10 NOTE — Progress Notes (Signed)
HPI: Patient returns for followup after last visit 10/05/2011 with Dr. Anne Hahn. Has a history of seizure disorder that has been in excellent control. It has been several years since the patient had any seizure activity, he is currently on gabapentin and carbamazepine tolerating the medications without side effects  No staring spells, confusion, sleep disturbances, lapses of time, headache and bowel and bladder incontinence. No falls or feelings of being off balance.     ROS:  - blurred vision, runny nose,   Physical Exam General: well developed, well nourished, seated, in no evident distress Head: head normocephalic and atraumatic. Oropharynx benign Neck: supple with no carotid or supraclavicular bruits Cardiovascular: regular rate and rhythm, no murmurs  Neurologic Exam Mental Status: Awake and fully alert. Follows all commands.  Mood and affect appropriate.  Cranial Nerves: Pupils equal, briskly reactive to light. Extraocular movements full without nystagmus. Visual fields full to confrontation. Hearing intact and symmetric to finger snap. Facial sensation intact. Face, tongue, palate move normally and symmetrically. Neck flexion and extension normal.  Motor: Normal bulk and tone. Normal strength in all tested extremity muscles. Coordination: Rapid alternating movements normal in all extremities. Finger-to-nose and heel-to-shin performed accurately bilaterally. Gait and Station: Arises from chair without difficulty. Stance is normal. Gait demonstrates normal stride length and balance . Able to heel, toe and tandem walk without difficulty.  Reflexes: 2+ and symmetric. Toes downgoing.     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   PLAN: Yearly labs today, CBC, CMP, carbamazepine level Continue current meds, printed prescriptions given per patient request Followup yearly or sooner  if problems arise  Nilda Riggs, GNP-BC  APRN

## 2012-10-11 LAB — CBC WITH DIFFERENTIAL/PLATELET
Basophils Absolute: 0 10*3/uL (ref 0.0–0.2)
Eosinophils Absolute: 0.2 10*3/uL (ref 0.0–0.4)
HCT: 46.7 % (ref 37.5–51.0)
Hemoglobin: 16.4 g/dL (ref 12.6–17.7)
Lymphocytes Absolute: 0.6 10*3/uL — ABNORMAL LOW (ref 0.7–3.1)
Lymphs: 12 % — ABNORMAL LOW (ref 14–46)
MCH: 29.7 pg (ref 26.6–33.0)
MCHC: 35.1 g/dL (ref 31.5–35.7)
Neutrophils Absolute: 3.9 10*3/uL (ref 1.4–7.0)

## 2012-10-11 LAB — COMPREHENSIVE METABOLIC PANEL
ALT: 19 IU/L (ref 0–44)
Albumin: 4.4 g/dL (ref 3.5–5.5)
Alkaline Phosphatase: 75 IU/L (ref 39–117)
BUN/Creatinine Ratio: 10 (ref 9–20)
BUN: 7 mg/dL (ref 6–24)
CO2: 28 mmol/L (ref 19–28)
Chloride: 101 mmol/L (ref 96–108)
Glucose: 84 mg/dL (ref 65–99)
Potassium: 4.4 mmol/L (ref 3.5–5.2)
Total Protein: 6.6 g/dL (ref 6.0–8.5)

## 2013-02-27 IMAGING — CR DG CERVICAL SPINE 2 OR 3 VIEWS
1 series · 1 of 1 positions shown · non-contrast
Comparison: None.

CLINICAL DATA: C5-7 ACDF.

CERVICAL SPINE - 2-3 VIEW

[view not recorded]
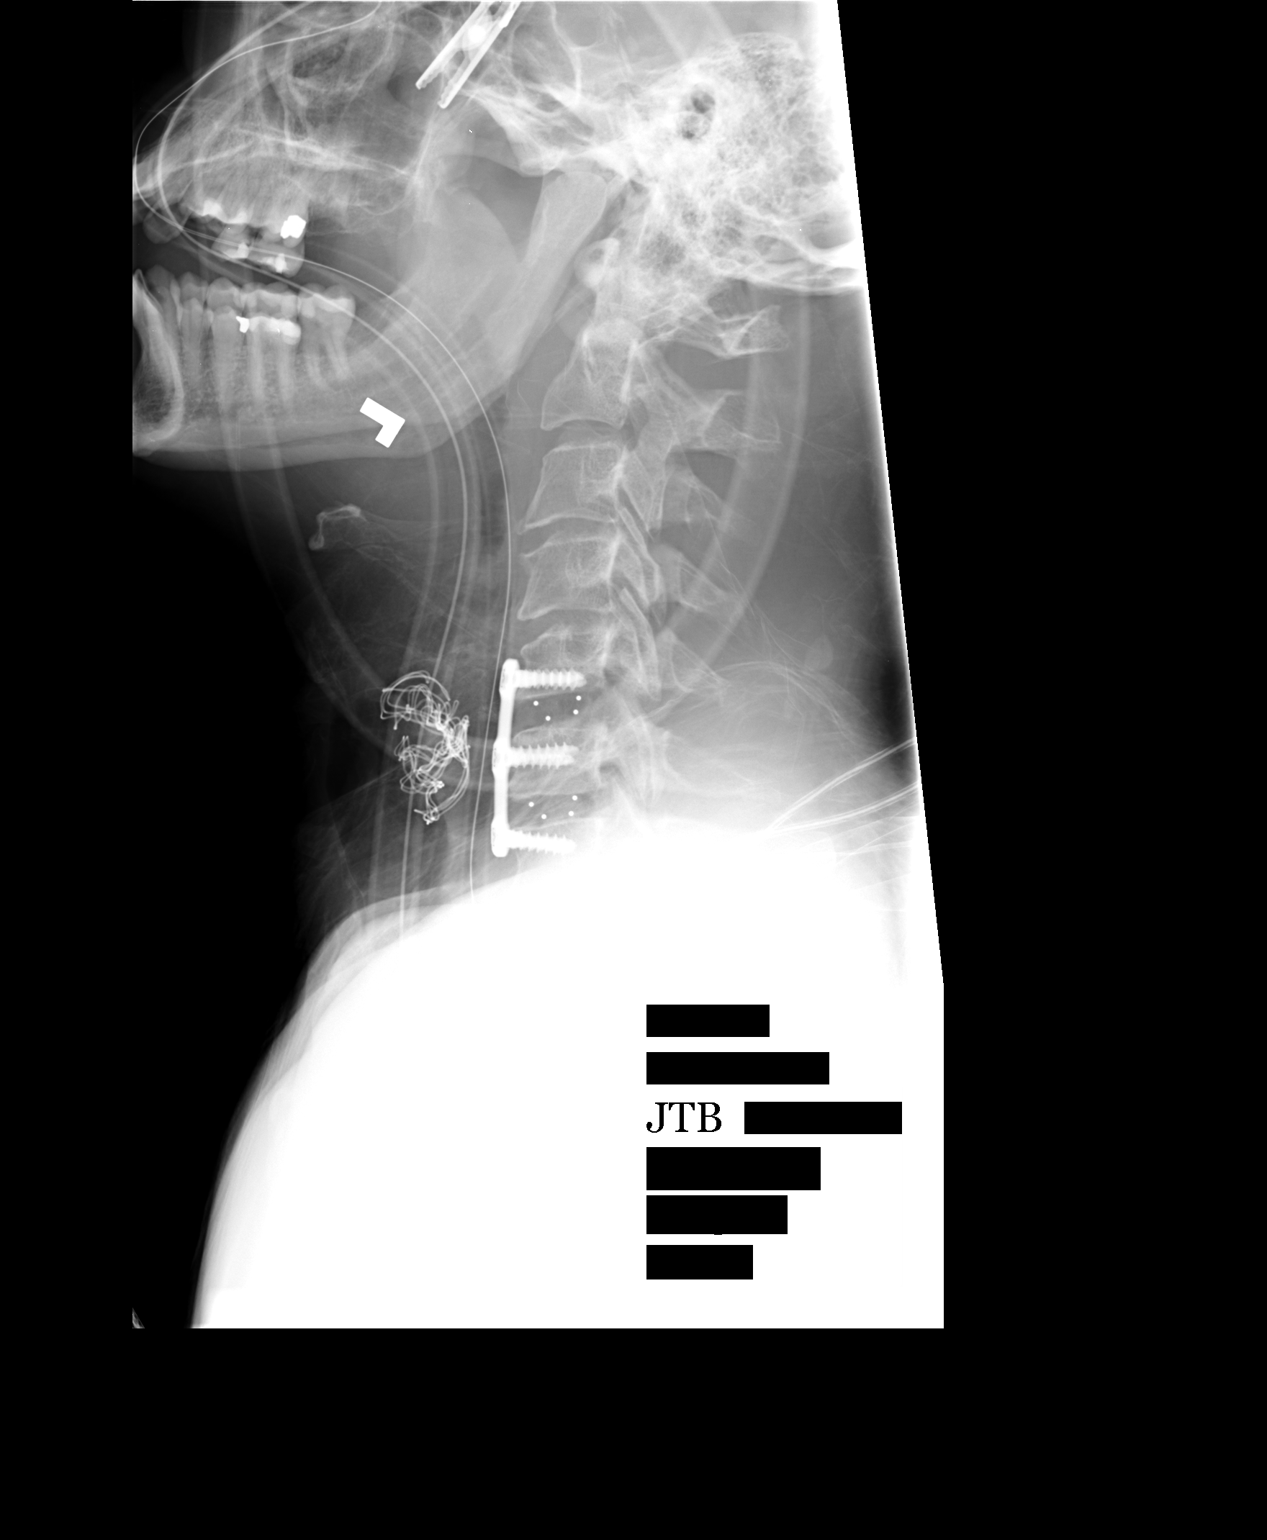

[1 of 1 positions shown; findings below may reference images not displayed]

FINDINGS: Provided two intraoperative views of the cervical spine.
Images demonstrate placement anterior plate and screws interbody
spacers at C5-7. Vertebral body height and alignment maintained.
IMPRESSION: C5-7 ACDF.

## 2013-08-31 ENCOUNTER — Other Ambulatory Visit: Payer: Self-pay | Admitting: Neurology

## 2013-09-22 ENCOUNTER — Ambulatory Visit: Payer: PRIVATE HEALTH INSURANCE | Admitting: Nurse Practitioner

## 2013-09-26 ENCOUNTER — Ambulatory Visit (INDEPENDENT_AMBULATORY_CARE_PROVIDER_SITE_OTHER): Payer: PRIVATE HEALTH INSURANCE | Admitting: Nurse Practitioner

## 2013-09-26 ENCOUNTER — Encounter: Payer: Self-pay | Admitting: Nurse Practitioner

## 2013-09-26 ENCOUNTER — Encounter (INDEPENDENT_AMBULATORY_CARE_PROVIDER_SITE_OTHER): Payer: Self-pay

## 2013-09-26 VITALS — BP 119/72 | HR 81 | Ht 70.0 in | Wt 143.0 lb

## 2013-09-26 DIAGNOSIS — G40209 Localization-related (focal) (partial) symptomatic epilepsy and epileptic syndromes with complex partial seizures, not intractable, without status epilepticus: Secondary | ICD-10-CM

## 2013-09-26 DIAGNOSIS — Z79899 Other long term (current) drug therapy: Secondary | ICD-10-CM

## 2013-09-26 DIAGNOSIS — G40309 Generalized idiopathic epilepsy and epileptic syndromes, not intractable, without status epilepticus: Secondary | ICD-10-CM

## 2013-09-26 MED ORDER — CARBAMAZEPINE ER 400 MG PO TB12: 400.0000 mg | ORAL_TABLET | Freq: Two times a day (BID) | ORAL | Status: DC

## 2013-09-26 MED ORDER — NEURONTIN 800 MG PO TABS: 800.0000 mg | ORAL_TABLET | Freq: Three times a day (TID) | ORAL | Status: DC

## 2013-09-26 MED ORDER — CARBAMAZEPINE ER 200 MG PO TB12: 200.0000 mg | ORAL_TABLET | Freq: Two times a day (BID) | ORAL | Status: DC

## 2013-09-26 NOTE — Progress Notes (Signed)
I have read the note, and I agree with the clinical assessment and plan.  France Lusty K Holmes Hays   

## 2013-09-26 NOTE — Patient Instructions (Signed)
Continue Tegretol at current dose will refill  continue gabapentin at current dose, will refill CBC CMP and carbamazepine level today will go to lab on [address] Follow up yearly

## 2013-09-26 NOTE — Progress Notes (Signed)
[facility]  PATIENT: [name] [name] DOB: 1971-05-31   REASON FOR VISIT: Followup for seizure disorder    HISTORY OF PRESENT ILLNESS: [name] [name], 43 year old male returns for followup. He has a history of seizure disorder and has not had any seizures since last seen, in fact it has been several years since he has had any seizures. He is currently on brand Tegretol-XR and Neurontin. He denies any staring spells confusion bowel or bladder incontinence. He lives with his parents. He has no new complaints. Review of systems is negative    HISTORY of seizure disorder that has been in excellent control. It has been several years since the patient had any seizure activity, he is currently on gabapentin and carbamazepine tolerating the medications without side effects No staring spells, confusion, sleep disturbances, lapses of time, headache and bowel and bladder incontinence. No falls or feelings of being off balance.      REVIEW OF SYSTEMS: Full 14 system review of systems performed and notable only for those listed, all others are neg:  Constitutional: N/A  Cardiovascular: N/A  Ear/Nose/Throat: N/A  Skin: N/A  Eyes: N/A  Respiratory: N/A  Gastroitestinal: N/A  Hematology/Lymphatic: N/A  Endocrine: N/A Musculoskeletal:N/A  Allergy/Immunology: N/A  Neurological: N/A Psychiatric: N/A   ALLERGIES: Allergies  Allergen Reactions  . Dilantin [Phenytoin Sodium Extended] Rash    HOME MEDICATIONS: Outpatient Prescriptions Prior to Visit  Medication Sig Dispense Refill  . carbamazepine (TEGRETOL XR) 200 MG 12 hr tablet Take 1 tablet (200 mg total) by mouth 2 (two) times daily. Takes with a 400 mg tablet for total of 600 mg  60 tablet  11  . carbamazepine (TEGRETOL XR) 400 MG 12 hr tablet Take 1 tablet (400 mg total) by mouth 2 (two) times daily. Takes with a 200 mg tablet for total of 600 mg  60 tablet  11  . NEURONTIN 800 MG tablet ONE TABLET THREE TIMES A DAY   90 tablet  0   No facility-administered medications prior to visit.    PAST MEDICAL HISTORY: Past Medical History  Diagnosis Date  . Seizures     PAST SURGICAL HISTORY: Past Surgical History  Procedure Laterality Date  . Neck surgery    . Chest surgery    . Cervical spine surgery      FAMILY HISTORY: Family History  Problem Relation Age of Onset  . Breast cancer Mother   . Cancer Father     SOCIAL HISTORY: History   Social History  . Marital Status: Single    Spouse Name: N/A    Number of Children: N/A  . Years of Education: N/A   Occupational History  . Not on file.   Social History Main Topics  . Smoking status: Never Smoker   . Smokeless tobacco: Never Used  . Alcohol Use: No  . Drug Use: No  . Sexual Activity: Not on file   Other Topics Concern  . Not on file   Social History Narrative   Patient lives at home with his parents.    Right handed   12 th grade   3 or more sodas daily     PHYSICAL EXAM  Filed Vitals:   09/26/13 1421  BP: 119/72  Pulse: 81  Height: 5\' 10"  (1.778 m)  Weight: 143 lb (64.864 kg)   Body mass index is 20.52 kg/(m^2).  Generalized: Well developed, in no acute distress   Neurological examination   Mentation: Alert oriented to time, place,  history taking. Follows all commands speech and language fluent  Cranial nerve II-XII: Pupils were equal round reactive to light extraocular movements were full, visual field were full on confrontational test. Facial sensation and strength were normal. hearing was intact to finger rubbing bilaterally. Uvula tongue midline. head turning and shoulder shrug were normal and symmetric.Tongue protrusion into cheek strength was normal. Motor: normal bulk and tone, full strength in the BUE, BLE, fine finger movements normal, no pronator drift. No focal weakness Sensory: normal and symmetric to light touch, pinprick, and  vibration  Coordination: finger-nose-finger, heel-to-shin bilaterally,  no dysmetria Reflexes: Brachioradialis 2/2, biceps 2/2, triceps 2/2, patellar 2/2, Achilles 2/2, plantar responses were flexor bilaterally. Gait and Station: Rising up from seated position without assistance, normal stance,  moderate stride, good arm swing, smooth turning, able to perform tiptoe, and heel walking without difficulty. Tandem gait is steady  DIAGNOSTIC DATA (LABS, IMAGING, TESTING) - I reviewed patient records, labs, notes, testing and imaging myself where available.  Lab Results  Component Value Date   WBC 5.2 10/10/2012   HGB 16.4 10/10/2012   HCT 46.7 10/10/2012   MCV 85 10/10/2012   PLT 179 09/14/2012      Component Value Date/Time   NA 139 10/10/2012 0918   NA 139 09/14/2012 0747   K 4.4 10/10/2012 0918   CL 101 10/10/2012 0918   CO2 28 10/10/2012 0918   GLUCOSE 84 10/10/2012 0918   GLUCOSE 175* 09/14/2012 0747   BUN 7 10/10/2012 0918   BUN 16 09/14/2012 0747   CREATININE 0.73* 10/10/2012 0918   CALCIUM 9.3 10/10/2012 0918   PROT 6.6 10/10/2012 0918   PROT 8.0 09/14/2012 0747   ALBUMIN 4.6 09/14/2012 0747   AST 18 10/10/2012 0918   ALT 19 10/10/2012 0918   ALKPHOS 75 10/10/2012 0918   BILITOT 0.5 10/10/2012 0918   GFRNONAA 116 10/10/2012 0918   GFRAA 134 10/10/2012 0918        ASSESSMENT AND PLAN  43 y.o. year old male  has a past medical history of Seizures. here to followup. No seizure activity in several years.  Continue Tegretol at current dose will refill  continue gabapentin at current dose, will refill CBC CMP and carbamazepine level today will go to lab on [address] Follow up yearly Nilda Riggs, [facility], [facility], APRN  [facility] [address] [place], [place] [address] [phone]

## 2013-09-27 LAB — COMPREHENSIVE METABOLIC PANEL
A/G RATIO: 1.8 (ref 1.1–2.5)
ALK PHOS: 83 IU/L (ref 39–117)
ALT: 18 IU/L (ref 0–44)
AST: 16 IU/L (ref 0–40)
Albumin: 4.3 g/dL (ref 3.5–5.5)
BILIRUBIN TOTAL: 0.5 mg/dL (ref 0.0–1.2)
BUN / CREAT RATIO: 9 (ref 9–20)
BUN: 8 mg/dL (ref 6–24)
CHLORIDE: 99 mmol/L (ref 96–108)
CO2: 32 mmol/L — ABNORMAL HIGH (ref 18–29)
Calcium: 9.2 mg/dL (ref 8.7–10.2)
Creatinine, Ser: 0.85 mg/dL (ref 0.76–1.27)
GFR, EST AFRICAN AMERICAN: 125 mL/min/{1.73_m2} (ref >59)
GFR, EST NON AFRICAN AMERICAN: 108 mL/min/{1.73_m2} (ref >59)
Globulin, Total: 2.4 g/dL (ref 1.5–4.5)
Glucose: 88 mg/dL (ref 65–99)
POTASSIUM: 4.1 mmol/L (ref 3.5–5.2)
SODIUM: 137 mmol/L (ref 134–144)
TOTAL PROTEIN: 6.7 g/dL (ref 6.0–8.5)

## 2013-09-27 LAB — CBC WITH DIFFERENTIAL/PLATELET
BASOS: 1 %
Basophils Absolute: 0 10*3/uL (ref 0.0–0.2)
EOS: 6 %
Eosinophils Absolute: 0.3 10*3/uL (ref 0.0–0.4)
HEMATOCRIT: 47 % (ref 37.5–51.0)
HEMOGLOBIN: 16.6 g/dL (ref 12.6–17.7)
LYMPHS ABS: 0.7 10*3/uL (ref 0.7–3.1)
Lymphs: 12 %
MCH: 29.5 pg (ref 26.6–33.0)
MCHC: 35.3 g/dL (ref 31.5–35.7)
MCV: 84 fL (ref 79–97)
MONOCYTES: 10 %
MONOS ABS: 0.6 10*3/uL (ref 0.1–0.9)
NEUTROS ABS: 4.1 10*3/uL (ref 1.4–7.0)
Neutrophils Relative %: 71 %
RBC: 5.63 x10E6/uL (ref 4.14–5.80)
RDW: 13.7 % (ref 12.3–15.4)
WBC: 5.7 10*3/uL (ref 3.4–10.8)

## 2013-09-27 LAB — CARBAMAZEPINE LEVEL, TOTAL: CARBAMAZEPINE LVL: 6.8 ug/mL (ref 4.0–12.0)

## 2013-09-29 NOTE — Progress Notes (Signed)
Quick Note:  Spoke to patient and relayed normal labs, per The PepsiCarolyn. ______

## 2014-09-19 ENCOUNTER — Other Ambulatory Visit: Payer: Self-pay | Admitting: Nurse Practitioner

## 2014-09-28 ENCOUNTER — Ambulatory Visit: Payer: PRIVATE HEALTH INSURANCE | Admitting: Nurse Practitioner

## 2014-09-29 ENCOUNTER — Other Ambulatory Visit: Payer: Self-pay | Admitting: Nurse Practitioner

## 2014-09-30 ENCOUNTER — Encounter: Payer: Self-pay | Admitting: Nurse Practitioner

## 2014-09-30 ENCOUNTER — Ambulatory Visit (INDEPENDENT_AMBULATORY_CARE_PROVIDER_SITE_OTHER): Payer: PRIVATE HEALTH INSURANCE | Admitting: Nurse Practitioner

## 2014-09-30 VITALS — BP 111/64 | HR 62 | Ht 70.0 in | Wt 146.0 lb

## 2014-09-30 DIAGNOSIS — G40209 Localization-related (focal) (partial) symptomatic epilepsy and epileptic syndromes with complex partial seizures, not intractable, without status epilepticus: Secondary | ICD-10-CM

## 2014-09-30 DIAGNOSIS — Z5181 Encounter for therapeutic drug level monitoring: Secondary | ICD-10-CM

## 2014-09-30 DIAGNOSIS — G40309 Generalized idiopathic epilepsy and epileptic syndromes, not intractable, without status epilepticus: Secondary | ICD-10-CM | POA: Diagnosis not present

## 2014-09-30 MED ORDER — TEGRETOL-XR 400 MG PO TB12: ORAL_TABLET | ORAL | Status: DC

## 2014-09-30 MED ORDER — TEGRETOL-XR 200 MG PO TB12: ORAL_TABLET | ORAL | Status: DC

## 2014-09-30 MED ORDER — NEURONTIN 800 MG PO TABS: ORAL_TABLET | ORAL | Status: DC

## 2014-09-30 NOTE — Progress Notes (Signed)
I have read the note, and I agree with the clinical assessment and plan.  [name] [name]   

## 2014-09-30 NOTE — Progress Notes (Signed)
[facility]  PATIENT: [name] [name] DOB: 1970/07/29   REASON FOR VISIT: Follow-up for seizure disorder HISTORY FROM: Patient    HISTORY OF PRESENT ILLNESS:[name] [name], 44 year old male returns for followup. He was last seen in this office 4/24/ 2015.He has a history of seizure disorder and has not had any seizures since last seen, in fact it has been several years since he has had any seizures. He is currently on brand Tegretol-XR and Neurontin. He denies any staring spells confusion bowel or bladder incontinence. He denies any feelings of being off balance or any falling. He lives with his parents. He has no new complaints. He needs medication refills and surveillance  labs.    HISTORY of seizure disorder that has been in excellent control. It has been several years since the patient had any seizure activity, he is currently on gabapentin and carbamazepine tolerating the medications without side effects No staring spells, confusion, sleep disturbances, lapses of time, headache and bowel and bladder incontinence. No falls or feelings of being off balance.     REVIEW OF SYSTEMS: Full 14 system review of systems performed and notable only for those listed, all others are neg:  Constitutional: neg  Cardiovascular: neg Ear/Nose/Throat: neg  Skin: neg Eyes: neg Respiratory: neg Gastroitestinal: neg  Hematology/Lymphatic: neg  Endocrine: neg Musculoskeletal:neg Allergy/Immunology: neg Neurological: neg Psychiatric: neg Sleep : neg   ALLERGIES: Allergies  Allergen Reactions  . Dilantin [Phenytoin Sodium Extended] Rash    HOME MEDICATIONS: Outpatient Prescriptions Prior to Visit  Medication Sig Dispense Refill  . NEURONTIN 800 MG tablet TAKE 1 TABLET (800 MG TOTAL) BY MOUTH 3 (THREE) TIMES DAILY. 90 tablet 0  . TEGRETOL-XR 200 MG 12 hr tablet TWICE A DAY 60 tablet 0  . TEGRETOL-XR 400 MG 12 hr tablet TWICE A DAY 60 tablet 0   No facility-administered  medications prior to visit.    PAST MEDICAL HISTORY: Past Medical History  Diagnosis Date  . Seizures     PAST SURGICAL HISTORY: Past Surgical History  Procedure Laterality Date  . Neck surgery    . Chest surgery    . Cervical spine surgery      FAMILY HISTORY: Family History  Problem Relation Age of Onset  . Breast cancer Mother   . Cancer Father     SOCIAL HISTORY: History   Social History  . Marital Status: Single    Spouse Name: N/A  . Number of Children: N/A  . Years of Education: N/A   Occupational History  . Not on file.   Social History Main Topics  . Smoking status: Never Smoker   . Smokeless tobacco: Never Used  . Alcohol Use: No  . Drug Use: No  . Sexual Activity: Not on file   Other Topics Concern  . Not on file   Social History Narrative   Patient lives at home with his parents.    Right handed   12 th grade   3 or more sodas daily     PHYSICAL EXAM  Filed Vitals:   09/30/14 1013  BP: 111/64  Pulse: 62  Height:  (1.778 m)  Weight: 146 lb (66.225 kg)   Body mass index is 20.95 kg/(m^2). Generalized: Well developed, in no acute distress   Neurological examination   Mentation: Alert oriented to time, place, history taking. Follows all commands speech and language fluent  Cranial nerve II-XII: Pupils were equal round reactive to light extraocular movements were full, visual field  were full on confrontational test. Facial sensation and strength were normal. hearing was intact to finger rubbing bilaterally. Uvula tongue midline. head turning and shoulder shrug were normal and symmetric.Tongue protrusion into cheek strength was normal. Motor: normal bulk and tone, full strength in the BUE, BLE, fine finger movements normal,  No focal weakness Sensory: normal and symmetric to light touch, pinprick, and vibration  Coordination: finger-nose-finger, heel-to-shin bilaterally, no dysmetria Reflexes: Brachioradialis 2/2, biceps 2/2,  triceps 2/2, patellar 2/2, Achilles 2/2, plantar responses were flexor bilaterally. Gait and Station: Rising up from seated position without assistance, normal stance, moderate stride, good arm swing, smooth turning, able to perform tiptoe, and heel walking without difficulty. Tandem gait is steady    DIAGNOSTIC DATA (LABS, IMAGING, TESTING)   ASSESSMENT AND PLAN  44 y.o. year old male  has a past medical history of Seizures. here to follow-up. No seizure activity in several years.  Continue Tegretol at current doses will refill for one year Continue Neurontin at current dose will refill for one year Check labs today, CBC CMP and CBZ level Follow-up yearly and when necessary Nilda RiggsNancy Carolyn Carnetta Losada, [facility] HospitalGNP, [facility] CenterBC, APRN  [facility] [address], [address] [place], [place] [address] [phone]

## 2014-09-30 NOTE — Patient Instructions (Signed)
Continue Tegretol at current doses will refill for one year Continue Neurontin at current dose will refill for one year Check labs today Follow-up yearly and when necessary

## 2014-10-01 ENCOUNTER — Telehealth: Payer: Self-pay

## 2014-10-01 LAB — COMPREHENSIVE METABOLIC PANEL
ALT: 21 IU/L (ref 0–44)
AST: 20 IU/L (ref 0–40)
Albumin/Globulin Ratio: 1.8 (ref 1.1–2.5)
Albumin: 4.4 g/dL (ref 3.5–5.5)
Alkaline Phosphatase: 76 IU/L (ref 39–117)
BUN / CREAT RATIO: 9 (ref 9–20)
BUN: 7 mg/dL (ref 6–24)
Bilirubin Total: 0.7 mg/dL (ref 0.0–1.2)
CALCIUM: 9.5 mg/dL (ref 8.7–10.2)
CHLORIDE: 99 mmol/L (ref 97–108)
CO2: 26 mmol/L (ref 18–29)
Creatinine, Ser: 0.81 mg/dL (ref 0.76–1.27)
GFR calc Af Amer: 127 mL/min/{1.73_m2} (ref >59)
GFR, EST NON AFRICAN AMERICAN: 110 mL/min/{1.73_m2} (ref >59)
GLUCOSE: 90 mg/dL (ref 65–99)
Globulin, Total: 2.5 g/dL (ref 1.5–4.5)
Potassium: 5.1 mmol/L (ref 3.5–5.2)
Sodium: 141 mmol/L (ref 134–144)
Total Protein: 6.9 g/dL (ref 6.0–8.5)

## 2014-10-01 LAB — CBC WITH DIFFERENTIAL/PLATELET
BASOS ABS: 0 10*3/uL (ref 0.0–0.2)
Basos: 1 %
EOS ABS: 0.2 10*3/uL (ref 0.0–0.4)
Eos: 4 %
HCT: 49.7 % (ref 37.5–51.0)
HEMOGLOBIN: 16.9 g/dL (ref 12.6–17.7)
Immature Grans (Abs): 0 10*3/uL (ref 0.0–0.1)
Immature Granulocytes: 0 %
LYMPHS: 13 %
Lymphocytes Absolute: 0.6 10*3/uL — ABNORMAL LOW (ref 0.7–3.1)
MCH: 29.5 pg (ref 26.6–33.0)
MCHC: 34 g/dL (ref 31.5–35.7)
MCV: 87 fL (ref 79–97)
MONOCYTES: 14 %
Monocytes Absolute: 0.6 10*3/uL (ref 0.1–0.9)
NEUTROS PCT: 68 %
Neutrophils Absolute: 3 10*3/uL (ref 1.4–7.0)
Platelets: 218 10*3/uL (ref 150–379)
RBC: 5.73 x10E6/uL (ref 4.14–5.80)
RDW: 12.9 % (ref 12.3–15.4)
WBC: 4.4 10*3/uL (ref 3.4–10.8)

## 2014-10-01 LAB — CARBAMAZEPINE LEVEL, TOTAL: CARBAMAZEPINE LVL: 6.7 ug/mL (ref 4.0–12.0)

## 2014-10-01 NOTE — Telephone Encounter (Signed)
-----   Message from Lynder ParentsNancy C Martin, NP sent at 10/01/2014  9:42 AM EDT ----- Labs good please call the patient

## 2014-10-01 NOTE — Telephone Encounter (Signed)
Called patient to give lab results. Woman that answered said patient was at work, will have him to return call. No DPR on file.

## 2014-10-01 NOTE — Telephone Encounter (Signed)
Patient returning call for lab results.  Please call [phone] and advise.

## 2014-10-01 NOTE — Telephone Encounter (Signed)
Returned patient's call, no answer. If patient call back,  Live Answer please tell patient's lab results are good. Any additional questions please send message. Thanks

## 2014-10-01 NOTE — Telephone Encounter (Signed)
Pt called back and relayed the message the patient's lab results were good, he said thank you for calling.

## 2014-10-17 ENCOUNTER — Other Ambulatory Visit: Payer: Self-pay | Admitting: Nurse Practitioner

## 2015-08-23 ENCOUNTER — Encounter: Payer: Self-pay | Admitting: Nurse Practitioner

## 2015-09-30 ENCOUNTER — Ambulatory Visit: Payer: PRIVATE HEALTH INSURANCE | Admitting: Nurse Practitioner

## 2015-10-04 ENCOUNTER — Other Ambulatory Visit: Payer: Self-pay | Admitting: Nurse Practitioner

## 2015-10-04 NOTE — Telephone Encounter (Signed)
Spoke to pt and will renew when in for appt this month.

## 2015-10-05 ENCOUNTER — Ambulatory Visit (INDEPENDENT_AMBULATORY_CARE_PROVIDER_SITE_OTHER): Payer: BLUE CROSS/BLUE SHIELD | Admitting: Nurse Practitioner

## 2015-10-05 ENCOUNTER — Encounter: Payer: Self-pay | Admitting: Nurse Practitioner

## 2015-10-05 VITALS — BP 116/72 | HR 71 | Ht 70.0 in | Wt 140.6 lb

## 2015-10-05 DIAGNOSIS — G40309 Generalized idiopathic epilepsy and epileptic syndromes, not intractable, without status epilepticus: Secondary | ICD-10-CM | POA: Diagnosis not present

## 2015-10-05 DIAGNOSIS — Z5181 Encounter for therapeutic drug level monitoring: Secondary | ICD-10-CM | POA: Diagnosis not present

## 2015-10-05 DIAGNOSIS — G40209 Localization-related (focal) (partial) symptomatic epilepsy and epileptic syndromes with complex partial seizures, not intractable, without status epilepticus: Secondary | ICD-10-CM

## 2015-10-05 MED ORDER — TEGRETOL-XR 200 MG PO TB12: ORAL_TABLET | ORAL | Status: DC

## 2015-10-05 MED ORDER — NEURONTIN 800 MG PO TABS: ORAL_TABLET | ORAL | Status: DC

## 2015-10-05 MED ORDER — TEGRETOL-XR 400 MG PO TB12: ORAL_TABLET | ORAL | Status: DC

## 2015-10-05 NOTE — Progress Notes (Signed)
[facility]  PATIENT: [name] [name] DOB: [birth year]   REASON FOR VISIT: Follow-up for generalized epilepsy, partial epilepsy HISTORY FROM: Patient    HISTORY OF PRESENT ILLNESS:[name] [name], 45 year old male returns for followup. He was last seen in this office 4/13/ 2016.He has a history of seizure disorder and has not had any seizures in several years. He is currently on brand Tegretol-XR and Neurontin. He denies any staring spells confusion, bowel or bladder incontinence. He denies any feelings of being off balance or any falling. He lives with his parents. He has no new complaints. He has not had any new medical conditions since last seen. He needs medication refills and surveillance labs.    HISTORY of seizure disorder that has been in excellent control. It has been several years since the patient had any seizure activity, he is currently on gabapentin and carbamazepine tolerating the medications without side effects No staring spells, confusion, sleep disturbances, lapses of time, headache and bowel and bladder incontinence. No falls or feelings of being off balance.     REVIEW OF SYSTEMS: Full 14 system review of systems performed and notable only for those listed, all others are neg:  Constitutional: neg  Cardiovascular: neg Ear/Nose/Throat: neg  Skin: neg Eyes: neg Respiratory: neg Gastroitestinal: neg  Hematology/Lymphatic: neg  Endocrine: neg Musculoskeletal:neg Allergy/Immunology: Allergies Neurological: neg Psychiatric: neg Sleep : neg   ALLERGIES: Allergies  Allergen Reactions  . Dilantin [Phenytoin Sodium Extended] Rash    HOME MEDICATIONS: Outpatient Prescriptions Prior to Visit  Medication Sig Dispense Refill  . HYDROcodone-acetaminophen (NORCO) 7.5-325 MG per tablet Take 1 tablet by mouth daily.  0  . NEURONTIN 800 MG tablet TAKE 1 TABLET (800 MG TOTAL) BY MOUTH 3 (THREE) TIMES DAILY. 90 tablet 11  . TEGRETOL-XR 200 MG 12 hr  tablet TWICE A DAY 60 tablet 11  . TEGRETOL-XR 400 MG 12 hr tablet TWICE A DAY 60 tablet 11  . penicillin v potassium (VEETID) 500 MG tablet Take 1 tablet by mouth daily.  0  . TEGRETOL-XR 200 MG 12 hr tablet TWICE A DAY 60 tablet 11   No facility-administered medications prior to visit.    PAST MEDICAL HISTORY: Past Medical History  Diagnosis Date  . Seizures (HCC)     PAST SURGICAL HISTORY: Past Surgical History  Procedure Laterality Date  . Neck surgery    . Chest surgery    . Cervical spine surgery      FAMILY HISTORY: Family History  Problem Relation Age of Onset  . Breast cancer Mother   . Cancer Father     SOCIAL HISTORY: Social History   Social History  . Marital Status: Single    Spouse Name: N/A  . Number of Children: N/A  . Years of Education: N/A   Occupational History  . Not on file.   Social History Main Topics  . Smoking status: Never Smoker   . Smokeless tobacco: Never Used  . Alcohol Use: No  . Drug Use: No  . Sexual Activity: Not on file   Other Topics Concern  . Not on file   Social History Narrative   Patient lives at home with his parents.    Right handed   12 th grade   3 or more sodas daily     PHYSICAL EXAM  Filed Vitals:   10/05/15 1005  BP: 116/72  Pulse: 71  Height: 5\' 10"  (1.778 m)  Weight: 140 lb 9.6 oz (63.776 kg)   Body  mass index is 20.17 kg/(m^2). Generalized: Well developed, in no acute distress   Neurological examination   Mentation: Alert oriented to time, place, history taking. Follows all commands speech and language fluent  Cranial nerve II-XII: Pupils were equal round reactive to light extraocular movements were full, visual field were full on confrontational test. Facial sensation and strength were normal. hearing was intact to finger rubbing bilaterally. Uvula tongue midline. head turning and shoulder shrug were normal and symmetric.Tongue protrusion into cheek strength was normal. Motor: normal bulk  and tone, full strength in the BUE, BLE, fine finger movements normal, No focal weakness Sensory: normal and symmetric to light touch,In the upper and lower extremities Coordination: finger-nose-finger, heel-to-shin bilaterally, no dysmetria Reflexes: Brachioradialis 2/2, biceps 2/2, triceps 2/2, patellar 2/2, Achilles 2/2, plantar responses were flexor bilaterally. Gait and Station: Rising up from seated position without assistance, normal stance, moderate stride, good arm swing, smooth turning, able to perform tiptoe, and heel walking without difficulty. Tandem gait is steady  DIAGNOSTIC DATA (LABS, IMAGING, TESTING)   ASSESSMENT AND PLAN 45 y.o. year old male  has a past medical history of Seizures here to follow-up. No seizure activity in several years.  Continue Tegretol at current doses will refill for one year Continue Neurontin at current dose will refill for one year Check labs today, CBC CMP and CBZ level Follow-up yearly and when necessary Call for any seizure activity Nilda Riggs, [facility], [facility], APRN  [facility] [address] [place], [place] [address] [phone]

## 2015-10-05 NOTE — Patient Instructions (Signed)
Continue Tegretol at current doses will refill for one year Continue Neurontin at current dose will refill for one year Check labs today, CBC CMP and CBZ level Follow-up yearly and when necessary

## 2015-10-06 ENCOUNTER — Telehealth: Payer: Self-pay | Admitting: Nurse Practitioner

## 2015-10-06 LAB — COMPREHENSIVE METABOLIC PANEL
A/G RATIO: 1.6 (ref 1.2–2.2)
ALT: 20 IU/L (ref 0–44)
AST: 18 IU/L (ref 0–40)
Albumin: 4.4 g/dL (ref 3.5–5.5)
Alkaline Phosphatase: 100 IU/L (ref 39–117)
BILIRUBIN TOTAL: 0.6 mg/dL (ref 0.0–1.2)
BUN/Creatinine Ratio: 10 (ref 9–20)
BUN: 8 mg/dL (ref 6–24)
CHLORIDE: 98 mmol/L (ref 96–106)
CO2: 26 mmol/L (ref 18–29)
Calcium: 9.3 mg/dL (ref 8.7–10.2)
Creatinine, Ser: 0.8 mg/dL (ref 0.76–1.27)
GFR calc Af Amer: 126 mL/min/{1.73_m2} (ref >59)
GFR calc non Af Amer: 109 mL/min/{1.73_m2} (ref >59)
Globulin, Total: 2.7 g/dL (ref 1.5–4.5)
Glucose: 89 mg/dL (ref 65–99)
POTASSIUM: 4.6 mmol/L (ref 3.5–5.2)
Sodium: 140 mmol/L (ref 134–144)
Total Protein: 7.1 g/dL (ref 6.0–8.5)

## 2015-10-06 LAB — CBC WITH DIFFERENTIAL/PLATELET
Basophils Absolute: 0 10*3/uL (ref 0.0–0.2)
Basos: 0 %
EOS (ABSOLUTE): 0.1 10*3/uL (ref 0.0–0.4)
Eos: 2 %
Hematocrit: 48.7 % (ref 37.5–51.0)
Hemoglobin: 16.8 g/dL (ref 12.6–17.7)
Immature Grans (Abs): 0 10*3/uL (ref 0.0–0.1)
Immature Granulocytes: 0 %
Lymphocytes Absolute: 0.6 10*3/uL — ABNORMAL LOW (ref 0.7–3.1)
Lymphs: 12 %
MCH: 30.2 pg (ref 26.6–33.0)
MCHC: 34.5 g/dL (ref 31.5–35.7)
MCV: 87 fL (ref 79–97)
Monocytes Absolute: 0.6 10*3/uL (ref 0.1–0.9)
Monocytes: 13 %
Neutrophils Absolute: 3.4 10*3/uL (ref 1.4–7.0)
Neutrophils: 73 %
Platelets: 218 10*3/uL (ref 150–379)
RBC: 5.57 x10E6/uL (ref 4.14–5.80)
RDW: 13 % (ref 12.3–15.4)
WBC: 4.8 10*3/uL (ref 3.4–10.8)

## 2015-10-06 LAB — CARBAMAZEPINE LEVEL, TOTAL: Carbamazepine (Tegretol), S: 6.5 ug/mL (ref 4.0–12.0)

## 2015-10-06 NOTE — Progress Notes (Signed)
Quick Note: ° °Called and spoke to patient relayed labs were normal. Patient understood. °______ °

## 2015-10-06 NOTE — Telephone Encounter (Signed)
Called and spoke to patient relayed Labs were normal. Patient understood.  °

## 2015-10-06 NOTE — Telephone Encounter (Signed)
-----   Message from Nilda RiggsNancy Carolyn Martin, NP sent at 10/06/2015  7:55 AM EDT ----- Please call normal labs

## 2015-10-14 ENCOUNTER — Other Ambulatory Visit: Payer: Self-pay | Admitting: Nurse Practitioner

## 2015-11-04 ENCOUNTER — Other Ambulatory Visit: Payer: Self-pay | Admitting: Nurse Practitioner

## 2015-11-05 ENCOUNTER — Other Ambulatory Visit: Payer: Self-pay | Admitting: Nurse Practitioner

## 2016-09-28 ENCOUNTER — Other Ambulatory Visit: Payer: Self-pay | Admitting: Nurse Practitioner

## 2016-10-04 ENCOUNTER — Encounter (INDEPENDENT_AMBULATORY_CARE_PROVIDER_SITE_OTHER): Payer: Self-pay

## 2016-10-04 ENCOUNTER — Ambulatory Visit (INDEPENDENT_AMBULATORY_CARE_PROVIDER_SITE_OTHER): Payer: BLUE CROSS/BLUE SHIELD | Admitting: Nurse Practitioner

## 2016-10-04 ENCOUNTER — Encounter: Payer: Self-pay | Admitting: Nurse Practitioner

## 2016-10-04 VITALS — BP 122/73 | HR 78 | Wt 150.0 lb

## 2016-10-04 DIAGNOSIS — G40209 Localization-related (focal) (partial) symptomatic epilepsy and epileptic syndromes with complex partial seizures, not intractable, without status epilepticus: Secondary | ICD-10-CM | POA: Diagnosis not present

## 2016-10-04 DIAGNOSIS — Z5181 Encounter for therapeutic drug level monitoring: Secondary | ICD-10-CM | POA: Diagnosis not present

## 2016-10-04 DIAGNOSIS — G40309 Generalized idiopathic epilepsy and epileptic syndromes, not intractable, without status epilepticus: Secondary | ICD-10-CM | POA: Diagnosis not present

## 2016-10-04 MED ORDER — NEURONTIN 800 MG PO TABS
ORAL_TABLET | ORAL | 11 refills | Status: DC
Start: 2016-10-04 — End: 2017-10-04

## 2016-10-04 NOTE — Progress Notes (Signed)
[facility]  PATIENT: [name] [name] DOB: 1971/04/19   REASON FOR VISIT: Follow-up for generalized epilepsy, partial epilepsy HISTORY FROM: Patient    HISTORY OF PRESENT ILLNESS:Mr. Kau, 46 year old male returns for yearly followup. He has a history of seizure disorder and has not had any seizures in several years. He is currently on brand Tegretol-XR and Neurontin. He denies any staring spells confusion, bowel or bladder incontinence. He denies any feelings of being off balance or any falling. He lives with his parents. . He has not had any new medical conditions since last seen. He needs medication refills and surveillance labs. He returns for reevaluation    HISTORY of seizure disorder that has been in excellent control. It has been several years since the patient had any seizure activity, he is currently on gabapentin and carbamazepine tolerating the medications without side effects No staring spells, confusion, sleep disturbances, lapses of time, headache and bowel and bladder incontinence. No falls or feelings of being off balance.     REVIEW OF SYSTEMS: Full 14 system review of systems performed and notable only for those listed, all others are neg:  Constitutional: neg  Cardiovascular: neg Ear/Nose/Throat: neg  Skin: neg Eyes: neg Respiratory: neg Gastroitestinal: neg  Hematology/Lymphatic: neg  Endocrine: neg Musculoskeletal:neg Allergy/Immunology: Allergies Neurological: neg Psychiatric: neg Sleep : neg   ALLERGIES: Allergies  Allergen Reactions  . Dilantin [Phenytoin Sodium Extended] Rash    HOME MEDICATIONS: Outpatient Medications Prior to Visit  Medication Sig Dispense Refill  . NEURONTIN 800 MG tablet TAKE 1 TABLET (800 MG TOTAL) BY MOUTH 3 (THREE) TIMES DAILY. 90 tablet 11  . TEGRETOL-XR 200 MG 12 hr tablet TWICE A DAY 60 tablet 11  . TEGRETOL-XR 400 MG 12 hr tablet TAKE 1 TABLET BY MOUTH TWICE A DAY 60 tablet 11  .  HYDROcodone-acetaminophen (NORCO) 7.5-325 MG per tablet Take 1 tablet by mouth daily.  0   No facility-administered medications prior to visit.     PAST MEDICAL HISTORY: Past Medical History:  Diagnosis Date  . Seizures (HCC)     PAST SURGICAL HISTORY: Past Surgical History:  Procedure Laterality Date  . CERVICAL SPINE SURGERY    . CHEST SURGERY    . NECK SURGERY      FAMILY HISTORY: Family History  Problem Relation Age of Onset  . Breast cancer Mother   . Cancer Father     SOCIAL HISTORY: Social History   Social History  . Marital status: Single    Spouse name: N/A  . Number of children: N/A  . Years of education: N/A   Occupational History  . Not on file.   Social History Main Topics  . Smoking status: Never Smoker  . Smokeless tobacco: Never Used  . Alcohol use No  . Drug use: No  . Sexual activity: Not on file   Other Topics Concern  . Not on file   Social History Narrative   Patient lives at home with his parents.    Right handed   12 th grade   3 or more sodas daily     PHYSICAL EXAM  Vitals:   10/04/16 1317  BP: 122/73  Pulse: 78  Weight: 150 lb (68 kg)   Body mass index is 21.52 kg/m.   Generalized: Well developed, in no acute distress  Head: normocephalic and atraumatic,. Oropharynx benign  Neck: Supple,  Cardiac: Regular rate rhythm,  Musculoskeletal: No deformity   Neurological examination   Mentation: Alert oriented  to time, place, history taking. Attention span and concentration appropriate. Recent and remote memory intact.  Follows all commands speech and language fluent.   Cranial nerve II-XII: Pupils were equal round reactive to light extraocular movements were full, visual field were full on confrontational test. Facial sensation and strength were normal. hearing was intact to finger rubbing bilaterally. Uvula tongue midline. head turning and shoulder shrug were normal and symmetric.Tongue protrusion into cheek strength was  normal. Motor: normal bulk and tone, full strength in the BUE, BLE, fine finger movements normal, no pronator drift. No focal weakness Sensory: normal and symmetric to light touch, pinprick, and  Vibration, in the upper and lower extremities  Coordination: finger-nose-finger, heel-to-shin bilaterally, no dysmetria Reflexes: Brachioradialis 2/2, biceps 2/2, triceps 2/2, patellar 2/2, Achilles 2/2, plantar responses were flexor bilaterally. Gait and Station: Rising up from seated position without assistance, normal stance,  moderate stride, good arm swing, smooth turning, able to perform tiptoe, and heel walking without difficulty. Tandem gait is steady   DIAGNOSTIC DATA (LABS, IMAGING, TESTING)   ASSESSMENT AND PLAN 46 y.o. year old male  has a past medical history of Seizures here to follow-up. No seizure activity in several years.  Continue Tegretol XR  twice daily refill for one year when labs are back Continue Tegretol  twice daily will refill Continue Neurontin  three times daily will refill for one year Check labs today, CBC CMP to monitor adverse effects of carbamazepine  CBZ level to check for therapeutic level/toxicity Follow-up yearly and when necessary Call for any seizure activity I spent 15 min in total face to face time with the patient more than 50% of which was spent counseling and coordination of care, reviewing test results reviewing medications and discussing and reviewing the diagnosis of seizure disorder and further treatment options. , Cline Crock, [facility], APRN  [facility] [address] [place], [place] [address] [phone]

## 2016-10-04 NOTE — Progress Notes (Signed)
I have read the note, and I agree with the clinical assessment and plan.  WILLIS,CHARLES KEITH   

## 2016-10-04 NOTE — Patient Instructions (Signed)
Continue Tegretol XR  twice daily refill for one year when labs are back Continue Tegretol  twice daily will refill Continue Neurontin  three times daily will refill for one year Check labs today, CBC CMP to monitor adverse effects of carbamazepine  CBZ level to check for therapeutic level/toxicity Follow-up yearly and when necessary Call for any seizure activity

## 2016-10-05 ENCOUNTER — Telehealth: Payer: Self-pay | Admitting: *Deleted

## 2016-10-05 ENCOUNTER — Other Ambulatory Visit: Payer: Self-pay | Admitting: Nurse Practitioner

## 2016-10-05 LAB — CBC WITH DIFFERENTIAL/PLATELET
BASOS: 1 %
Basophils Absolute: 0 10*3/uL (ref 0.0–0.2)
EOS (ABSOLUTE): 0.3 10*3/uL (ref 0.0–0.4)
EOS: 7 %
HEMATOCRIT: 45.8 % (ref 37.5–51.0)
HEMOGLOBIN: 16 g/dL (ref 13.0–17.7)
IMMATURE GRANS (ABS): 0 10*3/uL (ref 0.0–0.1)
IMMATURE GRANULOCYTES: 0 %
LYMPHS: 20 %
Lymphocytes Absolute: 0.8 10*3/uL (ref 0.7–3.1)
MCH: 29.7 pg (ref 26.6–33.0)
MCHC: 34.9 g/dL (ref 31.5–35.7)
MCV: 85 fL (ref 79–97)
MONOCYTES: 11 %
Monocytes Absolute: 0.4 10*3/uL (ref 0.1–0.9)
NEUTROS ABS: 2.2 10*3/uL (ref 1.4–7.0)
Neutrophils: 61 %
Platelets: 184 10*3/uL (ref 150–379)
RBC: 5.39 x10E6/uL (ref 4.14–5.80)
RDW: 13.4 % (ref 12.3–15.4)
WBC: 3.7 10*3/uL (ref 3.4–10.8)

## 2016-10-05 LAB — COMPREHENSIVE METABOLIC PANEL
A/G RATIO: 1.9 (ref 1.2–2.2)
ALBUMIN: 4.5 g/dL (ref 3.5–5.5)
ALT: 22 IU/L (ref 0–44)
AST: 19 IU/L (ref 0–40)
Alkaline Phosphatase: 74 IU/L (ref 39–117)
BILIRUBIN TOTAL: 0.5 mg/dL (ref 0.0–1.2)
BUN / CREAT RATIO: 14 (ref 9–20)
BUN: 11 mg/dL (ref 6–24)
CALCIUM: 9.1 mg/dL (ref 8.7–10.2)
CHLORIDE: 100 mmol/L (ref 96–106)
CO2: 31 mmol/L — ABNORMAL HIGH (ref 18–29)
Creatinine, Ser: 0.76 mg/dL (ref 0.76–1.27)
GFR, EST AFRICAN AMERICAN: 128 mL/min/{1.73_m2} (ref >59)
GFR, EST NON AFRICAN AMERICAN: 111 mL/min/{1.73_m2} (ref >59)
Globulin, Total: 2.4 g/dL (ref 1.5–4.5)
Glucose: 61 mg/dL — ABNORMAL LOW (ref 65–99)
Potassium: 4.5 mmol/L (ref 3.5–5.2)
Sodium: 142 mmol/L (ref 134–144)
TOTAL PROTEIN: 6.9 g/dL (ref 6.0–8.5)

## 2016-10-05 LAB — CARBAMAZEPINE LEVEL, TOTAL: Carbamazepine (Tegretol), S: 10.2 ug/mL (ref 4.0–12.0)

## 2016-10-05 MED ORDER — TEGRETOL-XR 400 MG PO TB12
400.0000 mg | ORAL_TABLET | Freq: Two times a day (BID) | ORAL | 11 refills | Status: DC
Start: 2016-10-05 — End: 2017-09-21

## 2016-10-05 MED ORDER — TEGRETOL-XR 200 MG PO TB12: ORAL_TABLET | ORAL | 11 refills | Status: DC

## 2016-10-05 NOTE — Telephone Encounter (Signed)
Per Enid Skeens, NP, spoke with patient and informed him his lab results are okay. He verbalized understanding, appreciation.

## 2016-10-15 ENCOUNTER — Other Ambulatory Visit: Payer: Self-pay | Admitting: Nurse Practitioner

## 2017-09-21 ENCOUNTER — Other Ambulatory Visit: Payer: Self-pay | Admitting: Nurse Practitioner

## 2017-10-03 NOTE — Progress Notes (Signed)
[facility]  PATIENT: [name] [name] DOB: 06-Oct-1970   REASON FOR VISIT: Follow-up for generalized epilepsy, partial epilepsy HISTORY FROM: Patient    HISTORY OF PRESENT ILLNESS:[name] [name], 47 year old male returns for yearly followup. He has a history of seizure disorder and has not had any seizures in several years. He is currently on brand Tegretol-XR and Neurontin. He denies any staring spells confusion, bowel or bladder incontinence. He denies any feelings of being off balance or any falling. He lives with his parents. . He has not had any new medical conditions since last seen. He needs medication refills and surveillance labs. He returns for reevaluation.  He has had seizures on generics in the past.    HISTORY of seizure disorder that has been in excellent control. It has been several years since the patient had any seizure activity, he is currently on gabapentin and carbamazepine tolerating the medications without side effects No staring spells, confusion, sleep disturbances, lapses of time, headache and bowel and bladder incontinence. No falls or feelings of being off balance.     REVIEW OF SYSTEMS: Full 14 system review of systems performed and notable only for those listed, all others are neg:  Constitutional: neg  Cardiovascular: neg Ear/Nose/Throat: neg  Skin: neg Eyes: neg Respiratory: neg Gastroitestinal: neg  Hematology/Lymphatic: neg  Endocrine: neg Musculoskeletal:neg Allergy/Immunology: Allergies Neurological: History of seizure disorder Psychiatric: neg Sleep : neg   ALLERGIES: Allergies  Allergen Reactions  . Dilantin [Phenytoin Sodium Extended] Rash    HOME MEDICATIONS: Outpatient Medications Prior to Visit  Medication Sig Dispense Refill  . NEURONTIN 800 MG tablet TAKE 1 TABLET (800 MG TOTAL) BY MOUTH 3 (THREE) TIMES DAILY. 90 tablet 11  . TEGRETOL-XR 200 MG 12 hr tablet TWICE A DAY 60 tablet 11  . TEGRETOL-XR 400 MG 12  hr tablet TAKE 1 TABLET BY MOUTH TWICE A DAY 60 tablet 11   No facility-administered medications prior to visit.     PAST MEDICAL HISTORY: Past Medical History:  Diagnosis Date  . Seizures (HCC)     PAST SURGICAL HISTORY: Past Surgical History:  Procedure Laterality Date  . CERVICAL SPINE SURGERY    . CHEST SURGERY    . NECK SURGERY      FAMILY HISTORY: Family History  Problem Relation Age of Onset  . Breast cancer Mother   . Cancer Father     SOCIAL HISTORY: Social History   Socioeconomic History  . Marital status: Single    Spouse name: Not on file  . Number of children: Not on file  . Years of education: Not on file  . Highest education level: Not on file  Occupational History  . Not on file  Social Needs  . Financial resource strain: Not on file  . Food insecurity:    Worry: Not on file    Inability: Not on file  . Transportation needs:    Medical: Not on file    Non-medical: Not on file  Tobacco Use  . Smoking status: Never Smoker  . Smokeless tobacco: Never Used  Substance and Sexual Activity  . Alcohol use: No  . Drug use: No  . Sexual activity: Not on file  Lifestyle  . Physical activity:    Days per week: Not on file    Minutes per session: Not on file  . Stress: Not on file  Relationships  . Social connections:    Talks on phone: Not on file    Gets together: Not  on file    Attends religious service: Not on file    Active member of club or organization: Not on file    Attends meetings of clubs or organizations: Not on file    Relationship status: Not on file  . Intimate partner violence:    Fear of current or ex partner: Not on file    Emotionally abused: Not on file    Physically abused: Not on file    Forced sexual activity: Not on file  Other Topics Concern  . Not on file  Social History Narrative   Patient lives at home with his parents.    Right handed   12 th grade   3 or more sodas daily     PHYSICAL EXAM  Vitals:    10/04/17 1232  BP: 106/69  Pulse: 61  Weight: 150 lb 12.8 oz (68.4 kg)  Height: 5\' 10"  (1.778 m)   Body mass index is 21.64 kg/m.   Generalized: Well developed, in no acute distress  Head: normocephalic and atraumatic,. Oropharynx benign  Neck: Supple,  Musculoskeletal: No deformity   Neurological examination   Mentation: Alert oriented to time, place, history taking. Attention span and concentration appropriate. Recent and remote memory intact.  Follows all commands speech and language fluent.   Cranial nerve II-XII: Pupils were equal round reactive to light extraocular movements were full, visual field were full on confrontational test. Facial sensation and strength were normal. hearing was intact to finger rubbing bilaterally. Uvula tongue midline. head turning and shoulder shrug were normal and symmetric.Tongue protrusion into cheek strength was normal. Motor: normal bulk and tone, full strength in the BUE, BLE, fine finger movements normal, no pronator drift. No focal weakness Sensory: normal and symmetric to light touch, pinprick, and  Vibration, in the upper and lower extremities  Coordination: finger-nose-finger, heel-to-shin bilaterally, no dysmetria Reflexes: Symmetric upper and lower, plantar responses were flexor bilaterally. Gait and Station: Rising up from seated position without assistance, normal stance,  moderate stride, good arm swing, smooth turning, able to perform tiptoe, and heel walking without difficulty. Tandem gait is steady   DIAGNOSTIC DATA (LABS, IMAGING, TESTING)   ASSESSMENT AND PLAN 47 y.o. year old male  has a past medical history of Seizures here to follow-up. No seizure activity in several years.  Patient is on brand drug.  He has failed generics in the past  Continue Tegretol XR 400mg  twice daily refill for one year when labs are back Continue Tegretol 200mg XR twice daily will refill Continue Neurontin 800mg  three times daily will refill for one  year Check labs today, CBC CMP to monitor adverse effects of carbamazepine  CBZ level to check for therapeutic level/toxicity Follow-up yearly and when necessary Call for any seizure activity I spent 15 min in total face to face time with the patient more than 50% of which was spent counseling and coordination of care, reviewing test results reviewing medications and discussing and reviewing the diagnosis of seizure disorder and further treatment options. , Cline Crock, [facility], APRN  [facility] [address], [address] [place], [place] [address] [phone]

## 2017-10-04 ENCOUNTER — Ambulatory Visit (INDEPENDENT_AMBULATORY_CARE_PROVIDER_SITE_OTHER): Payer: BLUE CROSS/BLUE SHIELD | Admitting: Nurse Practitioner

## 2017-10-04 ENCOUNTER — Encounter: Payer: Self-pay | Admitting: Nurse Practitioner

## 2017-10-04 VITALS — BP 106/69 | HR 61 | Ht 70.0 in | Wt 150.8 lb

## 2017-10-04 DIAGNOSIS — G40309 Generalized idiopathic epilepsy and epileptic syndromes, not intractable, without status epilepticus: Secondary | ICD-10-CM

## 2017-10-04 DIAGNOSIS — Z5181 Encounter for therapeutic drug level monitoring: Secondary | ICD-10-CM | POA: Diagnosis not present

## 2017-10-04 DIAGNOSIS — G40209 Localization-related (focal) (partial) symptomatic epilepsy and epileptic syndromes with complex partial seizures, not intractable, without status epilepticus: Secondary | ICD-10-CM | POA: Diagnosis not present

## 2017-10-04 MED ORDER — NEURONTIN 800 MG PO TABS: ORAL_TABLET | ORAL | 11 refills | Status: DC

## 2017-10-04 NOTE — Patient Instructions (Addendum)
Continue Tegretol XR 400mg  twice daily refill for one year when labs are back Continue Tegretol 200mg XR twice daily will refill Continue Neurontin 800mg  three times daily will refill for one year Check labs today, CBC CMP to monitor adverse effects of carbamazepine  CBZ level to check for therapeutic level/toxicity Follow-up yearly and when necessary Call for any seizure activity

## 2017-10-04 NOTE — Progress Notes (Signed)
I have read the note, and I agree with the clinical assessment and plan.  Roosevelt Eimers K Jalicia Roszak   

## 2017-10-05 ENCOUNTER — Other Ambulatory Visit: Payer: Self-pay | Admitting: Nurse Practitioner

## 2017-10-05 ENCOUNTER — Telehealth: Payer: Self-pay | Admitting: *Deleted

## 2017-10-05 LAB — COMPREHENSIVE METABOLIC PANEL
ALK PHOS: 76 IU/L (ref 39–117)
ALT: 22 IU/L (ref 0–44)
AST: 20 IU/L (ref 0–40)
Albumin/Globulin Ratio: 2 (ref 1.2–2.2)
Albumin: 4.7 g/dL (ref 3.5–5.5)
BILIRUBIN TOTAL: 0.6 mg/dL (ref 0.0–1.2)
BUN/Creatinine Ratio: 11 (ref 9–20)
BUN: 9 mg/dL (ref 6–24)
CHLORIDE: 98 mmol/L (ref 96–106)
CO2: 26 mmol/L (ref 20–29)
Calcium: 9.4 mg/dL (ref 8.7–10.2)
Creatinine, Ser: 0.85 mg/dL (ref 0.76–1.27)
GFR calc non Af Amer: 105 mL/min/{1.73_m2} (ref >59)
GFR, EST AFRICAN AMERICAN: 122 mL/min/{1.73_m2} (ref >59)
GLUCOSE: 78 mg/dL (ref 65–99)
Globulin, Total: 2.4 g/dL (ref 1.5–4.5)
Potassium: 4.3 mmol/L (ref 3.5–5.2)
Sodium: 139 mmol/L (ref 134–144)
TOTAL PROTEIN: 7.1 g/dL (ref 6.0–8.5)

## 2017-10-05 LAB — CBC WITH DIFFERENTIAL/PLATELET
BASOS ABS: 0 10*3/uL (ref 0.0–0.2)
Basos: 1 %
EOS (ABSOLUTE): 0.3 10*3/uL (ref 0.0–0.4)
Eos: 6 %
Hematocrit: 49.7 % (ref 37.5–51.0)
Hemoglobin: 16.7 g/dL (ref 13.0–17.7)
IMMATURE GRANS (ABS): 0 10*3/uL (ref 0.0–0.1)
Immature Granulocytes: 0 %
LYMPHS ABS: 0.8 10*3/uL (ref 0.7–3.1)
LYMPHS: 20 %
MCH: 29.4 pg (ref 26.6–33.0)
MCHC: 33.6 g/dL (ref 31.5–35.7)
MCV: 88 fL (ref 79–97)
Monocytes Absolute: 0.4 10*3/uL (ref 0.1–0.9)
Monocytes: 9 %
NEUTROS ABS: 2.6 10*3/uL (ref 1.4–7.0)
Neutrophils: 64 %
PLATELETS: 238 10*3/uL (ref 150–379)
RBC: 5.68 x10E6/uL (ref 4.14–5.80)
RDW: 13 % (ref 12.3–15.4)
WBC: 4 10*3/uL (ref 3.4–10.8)

## 2017-10-05 LAB — CARBAMAZEPINE LEVEL, TOTAL: CARBAMAZEPINE LVL: 11.2 ug/mL (ref 4.0–12.0)

## 2017-10-05 MED ORDER — TEGRETOL-XR 400 MG PO TB12: 400.0000 mg | ORAL_TABLET | Freq: Two times a day (BID) | ORAL | 11 refills | Status: DC

## 2017-10-05 MED ORDER — TEGRETOL-XR 200 MG PO TB12
ORAL_TABLET | ORAL | 11 refills | Status: DC
Start: 2017-10-05 — End: 2018-10-09

## 2017-10-05 NOTE — Telephone Encounter (Signed)
Spoke to pt and relayed that his lab results are stable, per CM/NP.  He verbalized understanding.

## 2017-10-05 NOTE — Telephone Encounter (Signed)
See other note

## 2017-10-05 NOTE — Telephone Encounter (Signed)
-----   Message from Nilda RiggsNancy Carolyn Martin, NP sent at 10/05/2017  8:04 AM EDT ----- Labs are stable please call the patient

## 2018-06-06 ENCOUNTER — Encounter: Payer: Self-pay | Admitting: Nurse Practitioner

## 2018-10-02 ENCOUNTER — Ambulatory Visit: Payer: BLUE CROSS/BLUE SHIELD | Admitting: Nurse Practitioner

## 2018-10-02 ENCOUNTER — Telehealth: Payer: Self-pay | Admitting: Neurology

## 2018-10-02 NOTE — Telephone Encounter (Signed)
Called and left message asking him to call me back about Virtual visit apt 10/07/2018 Annabelle Harman C.

## 2018-10-07 ENCOUNTER — Encounter: Payer: Self-pay | Admitting: Neurology

## 2018-10-07 ENCOUNTER — Ambulatory Visit (INDEPENDENT_AMBULATORY_CARE_PROVIDER_SITE_OTHER): Payer: BLUE CROSS/BLUE SHIELD | Admitting: Neurology

## 2018-10-07 ENCOUNTER — Other Ambulatory Visit: Payer: Self-pay

## 2018-10-07 DIAGNOSIS — Z5181 Encounter for therapeutic drug level monitoring: Secondary | ICD-10-CM

## 2018-10-07 DIAGNOSIS — G40209 Localization-related (focal) (partial) symptomatic epilepsy and epileptic syndromes with complex partial seizures, not intractable, without status epilepticus: Secondary | ICD-10-CM

## 2018-10-07 NOTE — Progress Notes (Signed)
I have read the note, and I agree with the clinical assessment and plan.  Sanjit Mcmichael K Jennene Downie   

## 2018-10-07 NOTE — Progress Notes (Signed)
    Virtual Visit via Video Note  I connected with [name] [name] on 10/07/18 at  1:45 PM EDT by a telephone and verified that I am speaking with the correct person using two identifiers.   I discussed the limitations of evaluation and management by telemedicine and the availability of in person appointments. The patient expressed understanding and agreed to proceed.  History of Present Illness: 10/07/2018 SS: [name] [name] is a 48 year old male with a history of seizure disorder.  He has had seizures on generic medications in the past.  He is currently taking Tegretol-XR 400 mg twice daily, Tegretol 200 mg XR, twice daily, Neurontin 800 mg 3 times a day.  He denies any recent seizures.  He reports his last seizure was several years ago.  He is currently living with her parents, working full-time sanding signs.  He is driving a car without difficulty.  He denies any falls or troubles with balance.  He denies any new problems or concerns.  He denies any changes to his medications.  [name] [name], 48 year old male returns for yearly followup. He has a history of seizure disorder and has not had any seizures in several years. He is currently on brand Tegretol-XR and Neurontin. He denies any staring spells confusion, bowel or bladder incontinence. He denies any feelings of being off balance or any falling. He lives with his parents. . He has not had any new medical conditions since last seen. He needs medication refills and surveillance labs. He returns for reevaluation.  He has had seizures on generics in the past.   Observations/Objective: Alert, answers questions appropriately  Assessment and Plan: 1.  Seizure disorder  Overall he appears to be doing quite well.  He denies any recent seizure events.  I will order lab work, he will come to the office within the next week to have blood done.  Once blood has resulted, I will send in refills for his medication.  He will continue taking Neurontin 800 mg 3  times a day, Tegretol XR 400 mg, twice a day, Tegretol-XR 200 mg, twice a day.  He will call for any recurrent seizure activity.  Follow Up Instructions: 1 year for revisit    I discussed the assessment and treatment plan with the patient. The patient was provided an opportunity to ask questions and all were answered. The patient agreed with the plan and demonstrated an understanding of the instructions.   The patient was advised to call back or seek an in-person evaluation if the symptoms worsen or if the condition fails to improve as anticipated.  I provided 22 minutes of non-face-to-face time during this encounter.   Otila Kluver, DNP  [facility] [address] [place], [place] [address] [phone]

## 2018-10-08 ENCOUNTER — Other Ambulatory Visit: Payer: Self-pay

## 2018-10-08 ENCOUNTER — Other Ambulatory Visit (INDEPENDENT_AMBULATORY_CARE_PROVIDER_SITE_OTHER): Payer: Self-pay

## 2018-10-08 DIAGNOSIS — Z5181 Encounter for therapeutic drug level monitoring: Secondary | ICD-10-CM

## 2018-10-08 DIAGNOSIS — Z0289 Encounter for other administrative examinations: Secondary | ICD-10-CM

## 2018-10-08 DIAGNOSIS — G40209 Localization-related (focal) (partial) symptomatic epilepsy and epileptic syndromes with complex partial seizures, not intractable, without status epilepticus: Secondary | ICD-10-CM

## 2018-10-09 ENCOUNTER — Telehealth: Payer: Self-pay | Admitting: Neurology

## 2018-10-09 LAB — COMPREHENSIVE METABOLIC PANEL
ALT: 24 IU/L (ref 0–44)
AST: 22 IU/L (ref 0–40)
Albumin/Globulin Ratio: 2 (ref 1.2–2.2)
Albumin: 4.9 g/dL (ref 4.0–5.0)
Alkaline Phosphatase: 85 IU/L (ref 39–117)
BUN/Creatinine Ratio: 11 (ref 9–20)
BUN: 9 mg/dL (ref 6–24)
Bilirubin Total: 0.3 mg/dL (ref 0.0–1.2)
CO2: 26 mmol/L (ref 20–29)
Calcium: 9.4 mg/dL (ref 8.7–10.2)
Chloride: 98 mmol/L (ref 96–106)
Creatinine, Ser: 0.85 mg/dL (ref 0.76–1.27)
GFR calc Af Amer: 121 mL/min/{1.73_m2} (ref >59)
GFR calc non Af Amer: 104 mL/min/{1.73_m2} (ref >59)
Globulin, Total: 2.4 g/dL (ref 1.5–4.5)
Glucose: 93 mg/dL (ref 65–99)
Potassium: 4.5 mmol/L (ref 3.5–5.2)
Sodium: 141 mmol/L (ref 134–144)
Total Protein: 7.3 g/dL (ref 6.0–8.5)

## 2018-10-09 LAB — CBC WITH DIFFERENTIAL/PLATELET
Basophils Absolute: 0 10*3/uL (ref 0.0–0.2)
Basos: 1 %
EOS (ABSOLUTE): 0.2 10*3/uL (ref 0.0–0.4)
Eos: 4 %
Hematocrit: 52.2 % — ABNORMAL HIGH (ref 37.5–51.0)
Hemoglobin: 17.6 g/dL (ref 13.0–17.7)
Immature Grans (Abs): 0 10*3/uL (ref 0.0–0.1)
Immature Granulocytes: 0 %
Lymphocytes Absolute: 0.9 10*3/uL (ref 0.7–3.1)
Lymphs: 20 %
MCH: 29.3 pg (ref 26.6–33.0)
MCHC: 33.7 g/dL (ref 31.5–35.7)
MCV: 87 fL (ref 79–97)
Monocytes Absolute: 0.5 10*3/uL (ref 0.1–0.9)
Monocytes: 11 %
Neutrophils Absolute: 3 10*3/uL (ref 1.4–7.0)
Neutrophils: 64 %
Platelets: 277 10*3/uL (ref 150–450)
RBC: 6.01 x10E6/uL — ABNORMAL HIGH (ref 4.14–5.80)
RDW: 12.3 % (ref 11.6–15.4)
WBC: 4.6 10*3/uL (ref 3.4–10.8)

## 2018-10-09 LAB — CARBAMAZEPINE LEVEL, TOTAL: Carbamazepine (Tegretol), S: 10.5 ug/mL (ref 4.0–12.0)

## 2018-10-09 MED ORDER — TEGRETOL-XR 400 MG PO TB12: 400.0000 mg | ORAL_TABLET | Freq: Two times a day (BID) | ORAL | 11 refills | Status: DC

## 2018-10-09 MED ORDER — TEGRETOL-XR 200 MG PO TB12: ORAL_TABLET | ORAL | 11 refills | Status: DC

## 2018-10-09 MED ORDER — NEURONTIN 800 MG PO TABS: ORAL_TABLET | ORAL | 11 refills | Status: DC

## 2018-10-09 NOTE — Telephone Encounter (Signed)
His lab work was unremarkable. I will send in refills of his medications.

## 2018-10-09 NOTE — Telephone Encounter (Signed)
Unable to get in contact with the patient due to the phone constantly ringing. I will attempt to contact later today.

## 2018-10-31 ENCOUNTER — Telehealth: Payer: Self-pay | Admitting: Neurology

## 2018-10-31 NOTE — Telephone Encounter (Signed)
I spoke to Jamesetta So his mother on the dpr to ask the patient to give [place] a call back to make his 1 years follow up with Donetta Potts.

## 2019-08-28 ENCOUNTER — Ambulatory Visit: Payer: PRIVATE HEALTH INSURANCE | Attending: Internal Medicine

## 2019-08-28 DIAGNOSIS — Z23 Encounter for immunization: Secondary | ICD-10-CM

## 2019-08-28 NOTE — Progress Notes (Signed)
   Covid-19 Vaccination Clinic  Name:  [name] [name]    MRN: [id number] DOB: January 28, 1971  08/28/2019  [name] was observed post Covid-19 immunization for 15 minutes without incident. He was provided with Vaccine Information Sheet and instruction to access the V-Safe system.   [name] was instructed to call 911 with any severe reactions post vaccine: Marland Kitchen Difficulty breathing  . Swelling of face and throat  . A fast heartbeat  . A bad rash all over body  . Dizziness and weakness   Immunizations Administered    Name Date Dose VIS Date Route   Pfizer COVID-19 Vaccine 08/28/2019  8:57 AM 0.3 mL 05/30/2019 Intramuscular   Manufacturer: [employer], [employer]   Lot: [id number]   NDC: [address]-4

## 2019-09-22 ENCOUNTER — Ambulatory Visit: Payer: PRIVATE HEALTH INSURANCE | Attending: Internal Medicine

## 2019-09-22 DIAGNOSIS — Z23 Encounter for immunization: Secondary | ICD-10-CM

## 2019-09-22 NOTE — Progress Notes (Signed)
   Covid-19 Vaccination Clinic  Name:  [name] [name]    MRN: [id number] DOB: 1970/07/29  09/22/2019  [name] was observed post Covid-19 immunization for 15 minutes without incident. He was provided with Vaccine Information Sheet and instruction to access the V-Safe system.   [name] was instructed to call 911 with any severe reactions post vaccine: Marland Kitchen Difficulty breathing  . Swelling of face and throat  . A fast heartbeat  . A bad rash all over body  . Dizziness and weakness   Immunizations Administered    Name Date Dose VIS Date Route   Pfizer COVID-19 Vaccine 09/22/2019  8:49 AM 0.3 mL 05/30/2019 Intramuscular   Manufacturer: [employer], [employer]   Lot: [id number]   NDC: [address]-7

## 2019-09-26 ENCOUNTER — Other Ambulatory Visit: Payer: Self-pay | Admitting: Neurology

## 2019-10-07 ENCOUNTER — Encounter: Payer: Self-pay | Admitting: Neurology

## 2019-10-07 ENCOUNTER — Other Ambulatory Visit: Payer: Self-pay

## 2019-10-07 ENCOUNTER — Ambulatory Visit (INDEPENDENT_AMBULATORY_CARE_PROVIDER_SITE_OTHER): Payer: BC Managed Care – PPO | Admitting: Neurology

## 2019-10-07 VITALS — BP 122/75 | HR 75 | Temp 98.4°F | Ht 67.0 in | Wt 151.0 lb

## 2019-10-07 DIAGNOSIS — G40309 Generalized idiopathic epilepsy and epileptic syndromes, not intractable, without status epilepticus: Secondary | ICD-10-CM

## 2019-10-07 NOTE — Progress Notes (Signed)
PATIENT: [name] [name] DOB: 1971/03/31  REASON FOR VISIT: follow up HISTORY FROM: patient  HISTORY OF PRESENT ILLNESS: Today 10/07/19  [name] [name] is a 49 year old male with history of seizure disorder.  He has had seizures on generic medications in the past.  He is currently taking brand name Tegretol XR 400 mg twice a day, Tegretol XR 200 mg twice daily, Neurontin 800 mg 3 times a day. He thinks his last seizure was in 2010.  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  He drives a car without difficulty.  He denies any new problems or concerns.  He presents today for evaluation unaccompanied.  HISTORY  10/07/2018 SS: [name] [name] is a 49 year old male with a history of seizure disorder.  He has had seizures on generic medications in the past.  He is currently taking Tegretol-XR 400 mg twice daily, Tegretol 200 mg XR, twice daily, Neurontin 800 mg 3 times a day.  He denies any recent seizures.  He reports his last seizure was several years ago.  He is currently living with her parents, working full-time sanding signs.  He is driving a car without difficulty.  He denies any falls or troubles with balance.  He denies any new problems or concerns.  He denies any changes to his medications.  REVIEW OF SYSTEMS: Out of a complete 14 system review of symptoms, the patient complains only of the following symptoms, and all other reviewed systems are negative.  Seizures  ALLERGIES: Allergies  Allergen Reactions  . Dilantin [Phenytoin Sodium Extended] Rash    HOME MEDICATIONS: Outpatient Medications Prior to Visit  Medication Sig Dispense Refill  . NEURONTIN 800 MG tablet TAKE 1 TABLET (800 MG TOTAL) BY MOUTH 3 (THREE) TIMES DAILY. 90 tablet 11  . TEGRETOL-XR 200 MG 12 hr tablet TAKE 1 TABLET BY MOUTH TWICE A DAY 60 tablet 11  . TEGRETOL-XR 400 MG 12 hr tablet TAKE 1 TABLET BY MOUTH TWICE A DAY 60 tablet 11   No facility-administered medications prior to visit.    PAST  MEDICAL HISTORY: Past Medical History:  Diagnosis Date  . Seizures (HCC)     PAST SURGICAL HISTORY: Past Surgical History:  Procedure Laterality Date  . CERVICAL SPINE SURGERY    . CHEST SURGERY    . NECK SURGERY      FAMILY HISTORY: Family History  Problem Relation Age of Onset  . Breast cancer Mother   . Cancer Father     SOCIAL HISTORY: Social History   Socioeconomic History  . Marital status: Single    Spouse name: Not on file  . Number of children: Not on file  . Years of education: Not on file  . Highest education level: Not on file  Occupational History  . Not on file  Tobacco Use  . Smoking status: Never Smoker  . Smokeless tobacco: Never Used  Substance and Sexual Activity  . Alcohol use: No  . Drug use: No  . Sexual activity: Not on file  Other Topics Concern  . Not on file  Social History Narrative   Patient lives at home with his parents.    Right handed   12 th grade   3 or more sodas daily   Social Determinants of Health   Financial Resource Strain:   . Difficulty of Paying Living Expenses:   Food Insecurity:   . Worried About Programme researcher, broadcasting/film/video in the Last Year:   . [employer]  in the Last Year:   Transportation Needs:   . Film/video editor (Medical):   Marland Kitchen Lack of Transportation (Non-Medical):   Physical Activity:   . Days of Exercise per Week:   . Minutes of Exercise per Session:   Stress:   . Feeling of Stress :   Social Connections:   . Frequency of Communication with Friends and Family:   . Frequency of Social Gatherings with Friends and Family:   . Attends Religious Services:   . Active Member of Clubs or Organizations:   . Attends Archivist Meetings:   Marland Kitchen Marital Status:   Intimate Partner Violence:   . Fear of Current or Ex-Partner:   . Emotionally Abused:   Marland Kitchen Physically Abused:   . Sexually Abused:    PHYSICAL EXAM  Vitals:   10/07/19 1455  BP: 122/75  Pulse: 75  Temp: 98.4 F (36.9 C)   Weight: 151 lb (68.5 kg)  Height: 5\' 7"  (1.702 m)   Body mass index is 23.65 kg/m.  Generalized: Well developed, in no acute distress  Neurological examination  Mentation: Alert oriented to time, place, history taking. Follows all commands speech and language fluent Cranial nerve II-XII: Pupils were equal round reactive to light. Extraocular movements were full, visual field were full on confrontational test. Facial sensation and strength were normal.  Head turning and shoulder shrug  were normal and symmetric. Motor: The motor testing reveals 5 over 5 strength of all 4 extremities. Good symmetric motor tone is noted throughout.  Sensory: Sensory testing is intact to soft touch on all 4 extremities. No evidence of extinction is noted.  Coordination: Cerebellar testing reveals good finger-nose-finger and heel-to-shin bilaterally.  Gait and station: Gait is normal. Tandem gait is normal. Romberg is negative. No drift is seen.  Reflexes: Deep tendon reflexes are symmetric and normal bilaterally.   DIAGNOSTIC DATA (LABS, IMAGING, TESTING) - I reviewed patient records, labs, notes, testing and imaging myself where available.  Lab Results  Component Value Date   WBC 4.6 10/08/2018   HGB 17.6 10/08/2018   HCT 52.2 (H) 10/08/2018   MCV 87 10/08/2018   PLT 277 10/08/2018      Component Value Date/Time   NA 141 10/08/2018 1527   K 4.5 10/08/2018 1527   CL 98 10/08/2018 1527   CO2 26 10/08/2018 1527   GLUCOSE 93 10/08/2018 1527   GLUCOSE 175 (H) 09/14/2012 0747   BUN 9 10/08/2018 1527   CREATININE 0.85 10/08/2018 1527   CALCIUM 9.4 10/08/2018 1527   PROT 7.3 10/08/2018 1527   ALBUMIN 4.9 10/08/2018 1527   AST 22 10/08/2018 1527   ALT 24 10/08/2018 1527   ALKPHOS 85 10/08/2018 1527   BILITOT 0.3 10/08/2018 1527   GFRNONAA 104 10/08/2018 1527   GFRAA 121 10/08/2018 1527   No results found for: CHOL, HDL, LDLCALC, LDLDIRECT, TRIG, CHOLHDL No results found for: HGBA1C Lab Results   Component Value Date   VITAMINB12 688 02/06/2006   No results found for: TSH   ASSESSMENT AND PLAN 49 y.o. year old male  has a past medical history of Seizures ([place]). here with:  1.  Seizure disorder, well-controlled  He continues to do quite well.  He has not had recurrent seizure in several years.  He will remain on brand-name medications, Neurontin and Tegretol XR at current doses.  I will check routine blood work today.  He will call for recurrent seizure, otherwise follow-up in 1 year.  I spent  20 minutes of face-to-face and non-face-to-face time with patient.  This included previsit chart review, lab review, study review, order entry, electronic health record documentation, patient education.  Margie Ege, AGNP-C, DNP 10/07/2019, 3:30 PM [facility] [address], [address] [place], [place] [address] [phone]

## 2019-10-07 NOTE — Patient Instructions (Signed)
It was nice to see you today! I am glad you are doing well  Continue current meds, check blood work today See you in 1 year , or sooner if needed

## 2019-10-08 ENCOUNTER — Other Ambulatory Visit: Payer: Self-pay | Admitting: Neurology

## 2019-10-08 ENCOUNTER — Telehealth: Payer: Self-pay | Admitting: *Deleted

## 2019-10-08 LAB — CBC WITH DIFFERENTIAL/PLATELET
Basophils Absolute: 0.1 10*3/uL (ref 0.0–0.2)
Basos: 1 %
EOS (ABSOLUTE): 0.2 10*3/uL (ref 0.0–0.4)
Eos: 4 %
Hematocrit: 50.2 % (ref 37.5–51.0)
Hemoglobin: 17.3 g/dL (ref 13.0–17.7)
Immature Grans (Abs): 0 10*3/uL (ref 0.0–0.1)
Immature Granulocytes: 0 %
Lymphocytes Absolute: 0.7 10*3/uL (ref 0.7–3.1)
Lymphs: 18 %
MCH: 30 pg (ref 26.6–33.0)
MCHC: 34.5 g/dL (ref 31.5–35.7)
MCV: 87 fL (ref 79–97)
Monocytes Absolute: 0.4 10*3/uL (ref 0.1–0.9)
Monocytes: 9 %
Neutrophils Absolute: 2.8 10*3/uL (ref 1.4–7.0)
Neutrophils: 68 %
Platelets: 268 10*3/uL (ref 150–450)
RBC: 5.76 x10E6/uL (ref 4.14–5.80)
RDW: 12.2 % (ref 11.6–15.4)
WBC: 4.2 10*3/uL (ref 3.4–10.8)

## 2019-10-08 LAB — COMPREHENSIVE METABOLIC PANEL
ALT: 21 IU/L (ref 0–44)
AST: 19 IU/L (ref 0–40)
Albumin/Globulin Ratio: 2.1 (ref 1.2–2.2)
Albumin: 4.8 g/dL (ref 4.0–5.0)
Alkaline Phosphatase: 91 IU/L (ref 39–117)
BUN/Creatinine Ratio: 9 (ref 9–20)
BUN: 8 mg/dL (ref 6–24)
Bilirubin Total: 0.3 mg/dL (ref 0.0–1.2)
CO2: 26 mmol/L (ref 20–29)
Calcium: 9.5 mg/dL (ref 8.7–10.2)
Chloride: 101 mmol/L (ref 96–106)
Creatinine, Ser: 0.86 mg/dL (ref 0.76–1.27)
GFR calc Af Amer: 119 mL/min/{1.73_m2} (ref >59)
GFR calc non Af Amer: 103 mL/min/{1.73_m2} (ref >59)
Globulin, Total: 2.3 g/dL (ref 1.5–4.5)
Glucose: 97 mg/dL (ref 65–99)
Potassium: 4.7 mmol/L (ref 3.5–5.2)
Sodium: 143 mmol/L (ref 134–144)
Total Protein: 7.1 g/dL (ref 6.0–8.5)

## 2019-10-08 LAB — CARBAMAZEPINE LEVEL, TOTAL: Carbamazepine (Tegretol), S: 10.5 ug/mL (ref 4.0–12.0)

## 2019-10-08 NOTE — Telephone Encounter (Addendum)
Called pt, his mother Jamesetta So (on [place]) answered. Pt at work. I gave her the message that pt's labs look good and he does not need to change his dosing. She verbalized appreciation and understanding.   ----- Message ----- From: Glean Salvo, NP Sent: 10/08/2019  10:59 AM EDT  Labs look good. No change in dosing.

## 2019-10-08 NOTE — Progress Notes (Signed)
I have read the note, and I agree with the clinical assessment and plan.  Malli Falotico K Philamena Kramar   

## 2019-10-09 ENCOUNTER — Telehealth: Payer: Self-pay | Admitting: *Deleted

## 2019-10-09 NOTE — Telephone Encounter (Signed)
Brand Neurontin PA, key: [id number], g 40.309. failed gabapentin (and carbamazepine) with break through seizures. Last known seizure 2010.   Your information has been submitted to [facility] [place]. [employer] [place] will review the request and notify you of the determination decision directly, typically within 72 hours of receiving all information.

## 2019-10-13 ENCOUNTER — Emergency Department (HOSPITAL_COMMUNITY)
Admission: EM | Admit: 2019-10-13 | Discharge: 2019-10-13 | Disposition: A | Discharge status: Home / Self Care | Payer: BC Managed Care – PPO | Attending: Emergency Medicine | Admitting: Emergency Medicine

## 2019-10-13 ENCOUNTER — Other Ambulatory Visit: Payer: Self-pay

## 2019-10-13 ENCOUNTER — Encounter (HOSPITAL_COMMUNITY): Payer: Self-pay | Admitting: Obstetrics and Gynecology

## 2019-10-13 DIAGNOSIS — Z5321 Procedure and treatment not carried out due to patient leaving prior to being seen by health care provider: Secondary | ICD-10-CM | POA: Insufficient documentation

## 2019-10-13 DIAGNOSIS — R1033 Periumbilical pain: Secondary | ICD-10-CM | POA: Diagnosis not present

## 2019-10-13 DIAGNOSIS — R112 Nausea with vomiting, unspecified: Secondary | ICD-10-CM | POA: Insufficient documentation

## 2019-10-13 LAB — CBC
HCT: 54.5 % — ABNORMAL HIGH (ref 39.0–52.0)
Hemoglobin: 18.4 g/dL — ABNORMAL HIGH (ref 13.0–17.0)
MCH: 30 pg (ref 26.0–34.0)
MCHC: 33.8 g/dL (ref 30.0–36.0)
MCV: 88.9 fL (ref 80.0–100.0)
Platelets: 287 10*3/uL (ref 150–400)
RBC: 6.13 MIL/uL — ABNORMAL HIGH (ref 4.22–5.81)
RDW: 12.2 % (ref 11.5–15.5)
WBC: 11.7 10*3/uL — ABNORMAL HIGH (ref 4.0–10.5)
nRBC: 0 % (ref 0.0–0.2)

## 2019-10-13 LAB — URINALYSIS, ROUTINE W REFLEX MICROSCOPIC
Bacteria, UA: NONE SEEN
Glucose, UA: NEGATIVE mg/dL
Hgb urine dipstick: NEGATIVE
Ketones, ur: 20 mg/dL — AB
Leukocytes,Ua: NEGATIVE
Nitrite: NEGATIVE
Protein, ur: 100 mg/dL — AB
Specific Gravity, Urine: 1.038 — ABNORMAL HIGH (ref 1.005–1.030)
pH: 5 (ref 5.0–8.0)

## 2019-10-13 LAB — COMPREHENSIVE METABOLIC PANEL
ALT: 20 U/L (ref 0–44)
AST: 19 U/L (ref 15–41)
Albumin: 4.8 g/dL (ref 3.5–5.0)
Alkaline Phosphatase: 81 U/L (ref 38–126)
Anion gap: 13 (ref 5–15)
BUN: 18 mg/dL (ref 6–20)
CO2: 27 mmol/L (ref 22–32)
Calcium: 9.6 mg/dL (ref 8.9–10.3)
Chloride: 102 mmol/L (ref 98–111)
Creatinine, Ser: 0.85 mg/dL (ref 0.61–1.24)
GFR calc Af Amer: >60 mL/min (ref >60)
GFR calc non Af Amer: >60 mL/min (ref >60)
Glucose, Bld: 118 mg/dL — ABNORMAL HIGH (ref 70–99)
Potassium: 4 mmol/L (ref 3.5–5.1)
Sodium: 142 mmol/L (ref 135–145)
Total Bilirubin: 1 mg/dL (ref 0.3–1.2)
Total Protein: 8 g/dL (ref 6.5–8.1)

## 2019-10-13 LAB — LIPASE, BLOOD: Lipase: 25 U/L (ref 11–51)

## 2019-10-13 MED ORDER — SODIUM CHLORIDE 0.9% FLUSH: 3.0000 mL | Freq: Once | INTRAVENOUS | Status: DC

## 2019-10-13 NOTE — Telephone Encounter (Addendum)
Per CMM, PA was canceled. Called [facility], spoke with [place]. Current Insurance: [place], [place] [id number], Grp: [id number], ID [id number], help desk [phone]. Started new PA on CMM, key: [id number] Your information has been submitted to [employer] [place]. Blue Cross [place] will review the request and notify you of the determination decision directly, typically within 72 hours of receiving all information. Call [phone] for PA questions.

## 2019-10-13 NOTE — ED Triage Notes (Signed)
Pt reports umbilical pain. Endorses n/v but no diarrhea. Pt's last BM was yesterday. Patient denies alcohol and drug use.

## 2019-10-13 NOTE — Telephone Encounter (Signed)
Per CMM, Neurontin brand approved today, Effective from 10/13/2019 through 10/11/2022. [facility]val [facility]ed to [facility], [address]

## 2019-10-15 ENCOUNTER — Telehealth: Payer: Self-pay | Admitting: Neurology

## 2019-10-15 NOTE — Telephone Encounter (Signed)
Pt called stating that he needs his NEURONTIN 800 MG tablet renewed and [facility] [address]

## 2019-10-15 NOTE — Telephone Encounter (Signed)
Called patient and informed him NP refilled Neurontin on 10/08/19 to his pharmacy; advised he call them. Patient verbalized understanding, appreciation.

## 2020-07-07 ENCOUNTER — Other Ambulatory Visit: Payer: Self-pay | Admitting: Neurology

## 2020-07-07 NOTE — Telephone Encounter (Signed)
Pt isn't due for refill until April but pharmacy made note saying "Pharmacy comment: Product Backordered/Unavailable:BRAND NAME ON BACK ORDER."

## 2020-09-08 ENCOUNTER — Telehealth: Payer: Self-pay | Admitting: Neurology

## 2020-09-08 NOTE — Telephone Encounter (Signed)
Pt states his NEURONTIN 800 MG tablet is no longer covered by his insurance, he would like to know what can be done to help him

## 2020-09-08 NOTE — Telephone Encounter (Signed)
I called pt and then pharmacy, trouble with doing PA on CMM.  Same #.  Spoke to [place], approval from 2021 until 2024 on BN Neurontin.  They will take care of updating this.  Should be able to get medication tomorrow or pharmacy can call if needed.

## 2020-09-09 NOTE — Telephone Encounter (Signed)
Received fax from pharmacy, neurontin 800 mg,  30 days cost $1557.99. I called [employer] [place], spoke with Luisa Hart who stated approval on file  10/11/19 - 10/11/2022. He stated his new plan requires new PA for this year. He will fax over new PA form. Received new PA form from [facility]. PA form completed, will get NP's  signature Monday.

## 2020-09-09 NOTE — Telephone Encounter (Signed)
Pt called, spoke with [employer], said physician need to renew prescription for NEURONTIN 800 MG tablet because insurance started over in February. Would like a call from the nurse.

## 2020-09-09 NOTE — Telephone Encounter (Signed)
I called pt spoke to wife, relayed that pt should be able to get medication Neurontin at the pharmacy.  If still issue, pharmacy is to call insurance.  She took message and will give to her husband who is at work.  Appreciated call back.

## 2020-09-12 ENCOUNTER — Other Ambulatory Visit: Payer: Self-pay | Admitting: Neurology

## 2020-09-13 ENCOUNTER — Telehealth: Payer: Self-pay | Admitting: Neurology

## 2020-09-13 NOTE — Telephone Encounter (Signed)
Called patient and informed him Neurontin was refilled Jan x 1 year. He stated pharmacy won't fill it because of insurance. I advised hm the PA was faxed over today, will let him know as soon as I hear back from [facility]. Patient verbalized understanding, appreciation.

## 2020-09-13 NOTE — Telephone Encounter (Signed)
[employer] PA for Neurontin signed and faxed ot [employer].

## 2020-09-13 NOTE — Telephone Encounter (Signed)
Pt request refill NEURONTIN 800 MG tablet at NEURONTIN 800 MG tablet

## 2020-09-20 NOTE — Telephone Encounter (Addendum)
Received fax from [facility] re: provide missing information.  Question answered, NP signed, faxed back to [facility]. Called patient, not at home. Called his mobile # and informed him of approval. Patient verbalized understanding, appreciation.

## 2020-09-20 NOTE — Telephone Encounter (Signed)
Received fax from [facility], Neurontin brand 800 mg approved 09/13/20- 09/12/2021.

## 2020-09-23 ENCOUNTER — Other Ambulatory Visit: Payer: Self-pay | Admitting: Neurology

## 2020-10-06 ENCOUNTER — Ambulatory Visit (INDEPENDENT_AMBULATORY_CARE_PROVIDER_SITE_OTHER): Payer: BC Managed Care – PPO | Admitting: Neurology

## 2020-10-06 ENCOUNTER — Encounter: Payer: Self-pay | Admitting: Neurology

## 2020-10-06 VITALS — BP 114/70 | HR 72 | Ht 67.0 in | Wt 149.0 lb

## 2020-10-06 DIAGNOSIS — G40309 Generalized idiopathic epilepsy and epileptic syndromes, not intractable, without status epilepticus: Secondary | ICD-10-CM | POA: Diagnosis not present

## 2020-10-06 MED ORDER — TEGRETOL-XR 200 MG PO TB12: ORAL_TABLET | ORAL | 11 refills | Status: DC

## 2020-10-06 NOTE — Patient Instructions (Signed)
Check labs today  Continue current medications Call for seizures  See you back in 1 year   

## 2020-10-06 NOTE — Progress Notes (Signed)
PATIENT: [name] [name] DOB: 09-26-1970  REASON FOR VISIT: follow up HISTORY FROM: patient  HISTORY OF PRESENT ILLNESS: Today 10/06/20  Mr. Solanki is a 50 year old male with history of seizure disorder.  Has had seizures on generic patients in the past.  Remains on brand-name Tegretol XR, Neurontin. Tolerates medications without side effect.  Last seizure was about 10 years ago.  Denies any changes to his health.  He lives with his parents.  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  Here today for evaluation unaccompanied.  Update 10/07/2019 SS: Mr. Rushlow is a 50 year old male with history of seizure disorder.  He has had seizures on generic medications in the past.  He is currently taking brand name Tegretol XR 400 mg twice a day, Tegretol XR 200 mg twice daily, Neurontin 800 mg 3 times a day. He thinks his last seizure was in 2010.  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  He drives a car without difficulty.  He denies any new problems or concerns.  He presents today for evaluation unaccompanied.  HISTORY  10/07/2018 SS: Mr. Sikorski is a 50 year old male with a history of seizure disorder.  He has had seizures on generic medications in the past.  He is currently taking Tegretol-XR 400 mg twice daily, Tegretol 200 mg XR, twice daily, Neurontin 800 mg 3 times a day.  He denies any recent seizures.  He reports his last seizure was several years ago.  He is currently living with her parents, working full-time sanding signs.  He is driving a car without difficulty.  He denies any falls or troubles with balance.  He denies any new problems or concerns.  He denies any changes to his medications.  REVIEW OF SYSTEMS: Out of a complete 14 system review of symptoms, the patient complains only of the following symptoms, and all other reviewed systems are negative.  Seizures  ALLERGIES: Allergies  Allergen Reactions  . Dilantin [Phenytoin Sodium Extended] Rash     HOME MEDICATIONS: Outpatient Medications Prior to Visit  Medication Sig Dispense Refill  . NEURONTIN 800 MG tablet TAKE 1 TABLET BY MOUTH THREE TIMES A DAY (PA FOR BRAND) 90 tablet 11  . TEGRETOL-XR 400 MG 12 hr tablet TAKE 1 TABLET BY MOUTH TWICE A DAY 60 tablet 11  . TEGRETOL-XR 200 MG 12 hr tablet TAKE 1 TABLET BY MOUTH TWICE A DAY 60 tablet 11   No facility-administered medications prior to visit.    PAST MEDICAL HISTORY: Past Medical History:  Diagnosis Date  . Seizures (HCC)     PAST SURGICAL HISTORY: Past Surgical History:  Procedure Laterality Date  . CERVICAL SPINE SURGERY    . CHEST SURGERY    . NECK SURGERY      FAMILY HISTORY: Family History  Problem Relation Age of Onset  . Breast cancer Mother   . Cancer Father     SOCIAL HISTORY: Social History   Socioeconomic History  . Marital status: Single    Spouse name: Not on file  . Number of children: Not on file  . Years of education: Not on file  . Highest education level: Not on file  Occupational History  . Not on file  Tobacco Use  . Smoking status: Never Smoker  . Smokeless tobacco: Never Used  Substance and Sexual Activity  . Alcohol use: No  . Drug use: No  . Sexual activity: Not on file  Other Topics Concern  . Not  on file  Social History Narrative   Patient lives at home with his parents.    Right handed   12 th grade   3 or more sodas daily   Social Determinants of Health   Financial Resource Strain: Not on file  Food Insecurity: Not on file  Transportation Needs: Not on file  Physical Activity: Not on file  Stress: Not on file  Social Connections: Not on file  Intimate Partner Violence: Not on file   PHYSICAL EXAM  Vitals:   10/06/20 1242  BP: 114/70  Pulse: 72  Weight: 149 lb (67.6 kg)  Height: 5\' 7"  (1.702 m)   Body mass index is 23.34 kg/m.  Generalized: Well developed, in no acute distress  Neurological examination  Mentation: Alert oriented to time, place,  history taking. Follows all commands speech and language fluent Cranial nerve II-XII: Pupils were equal round reactive to light. Extraocular movements were full, visual field were full on confrontational test. Facial sensation and strength were normal.  Head turning and shoulder shrug  were normal and symmetric. Motor: The motor testing reveals 5 over 5 strength of all 4 extremities. Good symmetric motor tone is noted throughout.  Sensory: Sensory testing is intact to soft touch on all 4 extremities. No evidence of extinction is noted.  Coordination: Cerebellar testing reveals good finger-nose-finger and heel-to-shin bilaterally.  Gait and station: Gait is normal. Tandem gait is normal. Romberg is negative. No drift is seen.  Reflexes: Deep tendon reflexes are symmetric and normal bilaterally.   DIAGNOSTIC DATA (LABS, IMAGING, TESTING) - I reviewed patient records, labs, notes, testing and imaging myself where available.  Lab Results  Component Value Date   WBC 11.7 (H) 10/13/2019   HGB 18.4 (H) 10/13/2019   HCT 54.5 (H) 10/13/2019   MCV 88.9 10/13/2019   PLT 287 10/13/2019      Component Value Date/Time   NA 142 10/13/2019 1530   NA 143 10/07/2019 1531   K 4.0 10/13/2019 1530   CL 102 10/13/2019 1530   CO2 27 10/13/2019 1530   GLUCOSE 118 (H) 10/13/2019 1530   BUN 18 10/13/2019 1530   BUN 8 10/07/2019 1531   CREATININE 0.85 10/13/2019 1530   CALCIUM 9.6 10/13/2019 1530   PROT 8.0 10/13/2019 1530   PROT 7.1 10/07/2019 1531   ALBUMIN 4.8 10/13/2019 1530   ALBUMIN 4.8 10/07/2019 1531   AST 19 10/13/2019 1530   ALT 20 10/13/2019 1530   ALKPHOS 81 10/13/2019 1530   BILITOT 1.0 10/13/2019 1530   BILITOT 0.3 10/07/2019 1531   GFRNONAA >60 10/13/2019 1530   GFRAA >60 10/13/2019 1530   No results found for: CHOL, HDL, LDLCALC, LDLDIRECT, TRIG, CHOLHDL No results found for: 10/15/2019 Lab Results  Component Value Date   VITAMINB12 688 02/06/2006   No results found for:  TSH   ASSESSMENT AND PLAN 49 y.o. year old male  has a past medical history of Seizures (HCC). here with:  1.  Seizure disorder, well-controlled  -Remain well controlled -Continue brand-name Neurontin and Tegretol XR  -Check routine labs -Call for recurrent seizure, otherwise follow-up 1 year or sooner if needed  I spent 20 minutes of face-to-face and non-face-to-face time with patient.  This included previsit chart review, lab review, study review, order entry, electronic health record documentation, patient education.  52, AGNP-C, DNP 10/06/2020, 12:56 PM [facility] [address] [place], [place] [phone]

## 2020-10-06 NOTE — Progress Notes (Signed)
I have read the note, and I agree with the clinical assessment and plan.  Christain Mcraney K Linard Daft   

## 2020-10-07 ENCOUNTER — Telehealth: Payer: Self-pay | Admitting: *Deleted

## 2020-10-07 LAB — COMPREHENSIVE METABOLIC PANEL
ALT: 22 IU/L (ref 0–44)
AST: 19 IU/L (ref 0–40)
Albumin/Globulin Ratio: 1.7 (ref 1.2–2.2)
Albumin: 4.5 g/dL (ref 4.0–5.0)
Alkaline Phosphatase: 91 IU/L (ref 44–121)
BUN/Creatinine Ratio: 11 (ref 9–20)
BUN: 10 mg/dL (ref 6–24)
Bilirubin Total: 0.4 mg/dL (ref 0.0–1.2)
CO2: 25 mmol/L (ref 20–29)
Calcium: 9.3 mg/dL (ref 8.7–10.2)
Chloride: 101 mmol/L (ref 96–106)
Creatinine, Ser: 0.89 mg/dL (ref 0.76–1.27)
Globulin, Total: 2.6 g/dL (ref 1.5–4.5)
Glucose: 77 mg/dL (ref 65–99)
Potassium: 4.7 mmol/L (ref 3.5–5.2)
Sodium: 142 mmol/L (ref 134–144)
Total Protein: 7.1 g/dL (ref 6.0–8.5)
eGFR: 106 mL/min/{1.73_m2} (ref >59)

## 2020-10-07 LAB — CBC WITH DIFFERENTIAL/PLATELET
Basophils Absolute: 0 10*3/uL (ref 0.0–0.2)
Basos: 1 %
EOS (ABSOLUTE): 0.2 10*3/uL (ref 0.0–0.4)
Eos: 6 %
Hematocrit: 48.3 % (ref 37.5–51.0)
Hemoglobin: 16.5 g/dL (ref 13.0–17.7)
Immature Grans (Abs): 0 10*3/uL (ref 0.0–0.1)
Immature Granulocytes: 0 %
Lymphocytes Absolute: 0.6 10*3/uL — ABNORMAL LOW (ref 0.7–3.1)
Lymphs: 17 %
MCH: 29.4 pg (ref 26.6–33.0)
MCHC: 34.2 g/dL (ref 31.5–35.7)
MCV: 86 fL (ref 79–97)
Monocytes Absolute: 0.4 10*3/uL (ref 0.1–0.9)
Monocytes: 13 %
Neutrophils Absolute: 2.1 10*3/uL (ref 1.4–7.0)
Neutrophils: 63 %
Platelets: 244 10*3/uL (ref 150–450)
RBC: 5.61 x10E6/uL (ref 4.14–5.80)
RDW: 12.4 % (ref 11.6–15.4)
WBC: 3.4 10*3/uL (ref 3.4–10.8)

## 2020-10-07 LAB — CARBAMAZEPINE LEVEL, TOTAL: Carbamazepine (Tegretol), S: 9.9 ug/mL (ref 4.0–12.0)

## 2020-10-07 NOTE — Telephone Encounter (Signed)
-----   Message from Anson Fret, MD sent at 10/07/2020 10:19 AM EDT ----- Blood work normal thanks

## 2020-10-07 NOTE — Telephone Encounter (Signed)
Called and spoke with mother about normal labs. Pt at work, she will let him know.

## 2021-08-14 ENCOUNTER — Other Ambulatory Visit: Payer: Self-pay | Admitting: Neurology

## 2021-08-15 NOTE — Telephone Encounter (Signed)
Rx refilled.

## 2021-09-14 ENCOUNTER — Other Ambulatory Visit: Payer: Self-pay | Admitting: Neurology

## 2021-09-14 NOTE — Telephone Encounter (Signed)
Rx's pended for refill. ?

## 2021-10-06 ENCOUNTER — Telehealth: Payer: Self-pay

## 2021-10-06 ENCOUNTER — Encounter: Payer: Self-pay | Admitting: Neurology

## 2021-10-06 ENCOUNTER — Ambulatory Visit (INDEPENDENT_AMBULATORY_CARE_PROVIDER_SITE_OTHER): Payer: No Typology Code available for payment source | Admitting: Neurology

## 2021-10-06 VITALS — BP 124/81 | HR 72 | Ht 67.0 in | Wt 148.5 lb

## 2021-10-06 DIAGNOSIS — G40309 Generalized idiopathic epilepsy and epileptic syndromes, not intractable, without status epilepticus: Secondary | ICD-10-CM

## 2021-10-06 MED ORDER — TEGRETOL-XR 400 MG PO TB12: 400.0000 mg | ORAL_TABLET | Freq: Two times a day (BID) | ORAL | 11 refills | Status: DC

## 2021-10-06 MED ORDER — TEGRETOL-XR 200 MG PO TB12: 200.0000 mg | ORAL_TABLET | Freq: Two times a day (BID) | ORAL | 11 refills | Status: DC

## 2021-10-06 NOTE — Telephone Encounter (Signed)
PA for neurontin 800 mg has been sent.  ? (Key: [id number]) ?

## 2021-10-06 NOTE — Patient Instructions (Signed)
Check labs today  ?Will work on Therapist, occupational of Neurontin  ?Call for any seizures ?See you back in 1 year ?

## 2021-10-06 NOTE — Progress Notes (Signed)
? ? ?Patient: [name] [name] ?Date of Birth: [birth year] ? ?Reason for Visit: follow up for seizures ?History From: patient ?Primary Neurologist: Dr. Teresa Coombs ? ?HISTORY OF PRESENT ILLNESS: ?Today 10/06/21  ?[name] [name] is here today for follow-up.  Carbamazepine at last visit was 9.9, CBC and CMP were unremarkable. Last seizure was more than 10 years ago. Seizure have been generalized unaware.  Requires brand-name Tegretol and Neurontin.  Reports in the past when he has tried generic he has had seizures.  Lives with his parents.  Drives a car. Has a job sanding signs.  Does not see PCP regularly.  Denies any new problems or concerns.  Has a letter from his new insurance stating they will not cover brand-name Neurontin. ? ?Update 10/06/2020 SS: Mr. [name] [name] is a 51 year old male with history of seizure disorder.  Has had seizures on generic patients in the past.  Remains on brand-name Tegretol XR, Neurontin. Tolerates medications without side effect.  Last seizure was about 10 years ago.  Denies any changes to his health.  He lives with his parents.  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  Here today for evaluation unaccompanied. ? ?Update 10/07/2019 SS: Mr. [name] [name] is a 51 year old male with history of seizure disorder.  He has had seizures on generic medications in the past.  He is currently taking brand name Tegretol XR 400 mg twice a day, Tegretol XR 200 mg twice daily, Neurontin 800 mg 3 times a day. He thinks his last seizure was in 2010.  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  He drives a car without difficulty.  He denies any new problems or concerns.  He presents today for evaluation unaccompanied. ? ?HISTORY ? 10/07/2018 SS: Mr. [name] [name] is a 51 year old male with a history of seizure disorder.  He has had seizures on generic medications in the past.  He is currently taking Tegretol-XR 400 mg twice daily, Tegretol 200 mg XR, twice daily, Neurontin 800 mg 3 times a day.  He denies any  recent seizures.  He reports his last seizure was several years ago.  He is currently living with her parents, working full-time sanding signs.  He is driving a car without difficulty.  He denies any falls or troubles with balance.  He denies any new problems or concerns.  He denies any changes to his medications. ? ?REVIEW OF SYSTEMS: Out of a complete 14 system review of symptoms, the patient complains only of the following symptoms, and all other reviewed systems are negative. ? ?See HPI ? ?ALLERGIES: ?Allergies  ?Allergen Reactions  ? Dilantin [Phenytoin Sodium Extended] Rash  ? ? ?HOME MEDICATIONS: ?Outpatient Medications Prior to Visit  ?Medication Sig Dispense Refill  ? NEURONTIN 800 MG tablet TAKE 1 TABLET BY MOUTH THREE TIMES A DAY 90 tablet 5  ? TEGRETOL-XR 200 MG 12 hr tablet TAKE 1 TABLET BY MOUTH TWICE A DAY 60 tablet 11  ? TEGRETOL-XR 400 MG 12 hr tablet TAKE 1 TABLET BY MOUTH TWICE A DAY 60 tablet 11  ? ?No facility-administered medications prior to visit.  ? ? ?PAST MEDICAL HISTORY: ?Past Medical History:  ?Diagnosis Date  ? Seizures (HCC)   ? ? ?PAST SURGICAL HISTORY: ?Past Surgical History:  ?Procedure Laterality Date  ? CERVICAL SPINE SURGERY    ? CHEST SURGERY    ? NECK SURGERY    ? ? ?FAMILY HISTORY: ?Family History  ?Problem Relation Age of Onset  ? Breast cancer Mother   ?  Cancer Father   ? ? ?SOCIAL HISTORY: ?Social History  ? ?Socioeconomic History  ? Marital status: Single  ?  Spouse name: Not on file  ? Number of children: Not on file  ? Years of education: Not on file  ? Highest education level: Not on file  ?Occupational History  ? Not on file  ?Tobacco Use  ? Smoking status: Never  ? Smokeless tobacco: Never  ?Substance and Sexual Activity  ? Alcohol use: No  ? Drug use: No  ? Sexual activity: Not on file  ?Other Topics Concern  ? Not on file  ?Social History Narrative  ? Patient lives at home with his parents.   ? Right handed  ? 12 th grade  ? 3 or more sodas daily  ? ?Social  Determinants of Health  ? ?Financial Resource Strain: Not on file  ?Food Insecurity: Not on file  ?Transportation Needs: Not on file  ?Physical Activity: Not on file  ?Stress: Not on file  ?Social Connections: Not on file  ?Intimate Partner Violence: Not on file  ? ?PHYSICAL EXAM ? ?Vitals:  ? 10/06/21 1251  ?BP: 124/81  ?Pulse: 72  ?Weight: 148 lb 8 oz (67.4 kg)  ?Height: 5\' 7"  (1.702 m)  ? ? ?Body mass index is 23.26 kg/m?. ? ?Generalized: Well developed, in no acute distress  ?Neurological examination  ?Mentation: Alert oriented to time, place, history taking. Follows all commands speech and language fluent ?Cranial nerve II-XII: Pupils were equal round reactive to light. Extraocular movements were full, visual field were full on confrontational test. Facial sensation and strength were normal.  Head turning and shoulder shrug  were normal and symmetric. ?Motor: The motor testing reveals 5 over 5 strength of all 4 extremities. Good symmetric motor tone is noted throughout.  ?Sensory: Sensory testing is intact to soft touch on all 4 extremities. No evidence of extinction is noted.  ?Coordination: Cerebellar testing reveals good finger-nose-finger and heel-to-shin bilaterally.  ?Gait and station: Gait is normal. Tandem gait is slightly unsteady. ?Reflexes: Deep tendon reflexes are symmetric and normal bilaterally.  ? ?DIAGNOSTIC DATA (LABS, IMAGING, TESTING) ?- I reviewed patient records, labs, notes, testing and imaging myself where available. ? ?Lab Results  ?Component Value Date  ? WBC 3.4 10/06/2020  ? HGB 16.5 10/06/2020  ? HCT 48.3 10/06/2020  ? MCV 86 10/06/2020  ? PLT 244 10/06/2020  ? ?   ?Component Value Date/Time  ? NA 142 10/06/2020 1312  ? K 4.7 10/06/2020 1312  ? CL 101 10/06/2020 1312  ? CO2 25 10/06/2020 1312  ? GLUCOSE 77 10/06/2020 1312  ? GLUCOSE 118 (H) 10/13/2019 1530  ? BUN 10 10/06/2020 1312  ? CREATININE 0.89 10/06/2020 1312  ? CALCIUM 9.3 10/06/2020 1312  ? PROT 7.1 10/06/2020 1312  ?  ALBUMIN 4.5 10/06/2020 1312  ? AST 19 10/06/2020 1312  ? ALT 22 10/06/2020 1312  ? ALKPHOS 91 10/06/2020 1312  ? BILITOT 0.4 10/06/2020 1312  ? GFRNONAA >60 10/13/2019 1530  ? GFRAA >60 10/13/2019 1530  ? ?No results found for: CHOL, HDL, LDLCALC, LDLDIRECT, TRIG, CHOLHDL ?No results found for: HGBA1C ?Lab Results  ?Component Value Date  ? 10/15/2019 688 02/06/2006  ? ?No results found for: TSH ? ? ?ASSESSMENT AND PLAN ?51 y.o. year old male  has a past medical history of Seizures (HCC). here with: ? ?1.  Seizure disorder, well-controlled ? ?-Doing well, last seizure was over 10 years ago ?-Will continue current medications, he is  fearful any adjustment will result in seizure, in the past when he has tried generic medications reportedly has had seizures ?-Will continue current doses of brand-name Neurontin and Tegretol XR ?-Check routine labs today ?-Encouraged him to establish with a primary care doctor ?-Follow-up in 1 year or sooner if needed ? ?Margie EgeSarah Lynniah Janoski, AGNP-C, DNP 10/06/2021, 1:18 PM ?[facility] ?[address], [address] ?[place], [place] [address] ?[phone]) [phone] ? ? ?

## 2021-10-07 LAB — CBC WITH DIFFERENTIAL/PLATELET
Basophils Absolute: 0.1 10*3/uL (ref 0.0–0.2)
Basos: 1 %
EOS (ABSOLUTE): 0.4 10*3/uL (ref 0.0–0.4)
Eos: 11 %
Hematocrit: 50.6 % (ref 37.5–51.0)
Hemoglobin: 17.4 g/dL (ref 13.0–17.7)
Immature Grans (Abs): 0 10*3/uL (ref 0.0–0.1)
Immature Granulocytes: 0 %
Lymphocytes Absolute: 0.7 10*3/uL (ref 0.7–3.1)
Lymphs: 17 %
MCH: 30.1 pg (ref 26.6–33.0)
MCHC: 34.4 g/dL (ref 31.5–35.7)
MCV: 87 fL (ref 79–97)
Monocytes Absolute: 0.5 10*3/uL (ref 0.1–0.9)
Monocytes: 12 %
Neutrophils Absolute: 2.4 10*3/uL (ref 1.4–7.0)
Neutrophils: 59 %
Platelets: 242 10*3/uL (ref 150–450)
RBC: 5.79 x10E6/uL (ref 4.14–5.80)
RDW: 12.2 % (ref 11.6–15.4)
WBC: 4 10*3/uL (ref 3.4–10.8)

## 2021-10-07 LAB — COMPREHENSIVE METABOLIC PANEL
ALT: 19 IU/L (ref 0–44)
AST: 20 IU/L (ref 0–40)
Albumin/Globulin Ratio: 1.9 (ref 1.2–2.2)
Albumin: 4.5 g/dL (ref 4.0–5.0)
Alkaline Phosphatase: 82 IU/L (ref 44–121)
BUN/Creatinine Ratio: 13 (ref 9–20)
BUN: 10 mg/dL (ref 6–24)
Bilirubin Total: 0.3 mg/dL (ref 0.0–1.2)
CO2: 28 mmol/L (ref 20–29)
Calcium: 9.3 mg/dL (ref 8.7–10.2)
Chloride: 100 mmol/L (ref 96–106)
Creatinine, Ser: 0.79 mg/dL (ref 0.76–1.27)
Globulin, Total: 2.4 g/dL (ref 1.5–4.5)
Glucose: 77 mg/dL (ref 70–99)
Potassium: 4.1 mmol/L (ref 3.5–5.2)
Sodium: 140 mmol/L (ref 134–144)
Total Protein: 6.9 g/dL (ref 6.0–8.5)
eGFR: 109 mL/min/{1.73_m2} (ref >59)

## 2021-10-07 LAB — VITAMIN D 25 HYDROXY (VIT D DEFICIENCY, FRACTURES): Vit D, 25-Hydroxy: 21.6 ng/mL — ABNORMAL LOW (ref 30.0–100.0)

## 2021-10-07 LAB — CARBAMAZEPINE LEVEL, TOTAL: Carbamazepine (Tegretol), S: 9.7 ug/mL (ref 4.0–12.0)

## 2021-10-07 LAB — GABAPENTIN LEVEL: Gabapentin Lvl: 10.6 ug/mL (ref 4.0–16.0)

## 2021-10-10 NOTE — Telephone Encounter (Signed)
We received a letter from [employer]. States that brand name Neurontin is a covered medication and no prior authorization is needed now.  ? ?The listed fax # is [phone]. ? ?For questions, call [phone], ext [phone]. ?_____________________________________ ?I called the patient and provided him with this update. ?

## 2021-10-11 ENCOUNTER — Telehealth: Payer: Self-pay | Admitting: *Deleted

## 2021-10-11 NOTE — Telephone Encounter (Signed)
-----   Message from Glean Salvo, NP sent at 10/11/2021  5:54 AM EDT ----- ?Please call the patient, blood work is within normal parameters with the exception of slightly low vitamin D at 21.6, would benefit from daily 800 to 1000 units OTC. Thanks!! ?

## 2021-10-11 NOTE — Telephone Encounter (Signed)
Spoke with patient and informed of results. Instructed pt to began vitamin D supplement of 800 to 1000 units. Pt verbalized understanding and expressed appreciation for the call. ?

## 2022-02-08 ENCOUNTER — Other Ambulatory Visit: Payer: Self-pay | Admitting: Neurology

## 2022-06-28 ENCOUNTER — Telehealth: Payer: Self-pay | Admitting: Neurology

## 2022-06-28 NOTE — Telephone Encounter (Signed)
Pt is calling. Stated he received a letter from his insurance stating they will no longer pay for medication NEURONTIN 800 MG tablet.

## 2022-06-29 NOTE — Telephone Encounter (Signed)
Pt states nothing has changed with his insurance. He is being told insurance won't cover Neurontin at all. He states generic trial in the past resulted in him having seizures and it didn't work. We will do a PA. Last year it was on formulary per previous phone note.

## 2022-06-29 NOTE — Telephone Encounter (Signed)
Completed Neurontin brand PA on CMM. Key: [id number]. Awaiting determination from [employer].

## 2022-07-03 NOTE — Telephone Encounter (Signed)
Received a fax from [employer] stating they received the PA for Neurontin 800 mg but it is a covered medication and no prior authorization is needed now. I called [facility] and was told pt just filled at the end of December so right now its too early for a refill.   I called the pt and his mother Silva Bandy (on [place]) answered and gave me pt's work number [phone]. I called pt and let him know he should be able to fill Neurontin. Received message from insurance stating PA not needed. He was appreciative.

## 2022-08-08 ENCOUNTER — Other Ambulatory Visit: Payer: Self-pay

## 2022-08-08 ENCOUNTER — Encounter: Payer: Self-pay | Admitting: Emergency Medicine

## 2022-08-08 ENCOUNTER — Ambulatory Visit
Admission: EM | Admit: 2022-08-08 | Discharge: 2022-08-08 | Disposition: A | Discharge status: Home / Self Care | Payer: No Typology Code available for payment source | Attending: Internal Medicine | Admitting: Internal Medicine

## 2022-08-08 DIAGNOSIS — B9689 Other specified bacterial agents as the cause of diseases classified elsewhere: Secondary | ICD-10-CM

## 2022-08-08 DIAGNOSIS — H109 Unspecified conjunctivitis: Secondary | ICD-10-CM | POA: Diagnosis not present

## 2022-08-08 MED ORDER — LORATADINE 10 MG PO TABS: 10.0000 mg | ORAL_TABLET | Freq: Every day | ORAL | 0 refills | Status: DC

## 2022-08-08 MED ORDER — ERYTHROMYCIN 5 MG/GM OP OINT: TOPICAL_OINTMENT | OPHTHALMIC | 0 refills | Status: DC

## 2022-08-08 NOTE — ED Triage Notes (Signed)
Pt here for bilateral eye redness with drainage x 3 days

## 2022-08-08 NOTE — ED Provider Notes (Signed)
[facility]    CSN: [id number] Arrival date & time: 08/08/22  1528      History   Chief Complaint Chief Complaint  Patient presents with   Conjunctivitis    HPI [name] [name] is a 52 y.o. male.   Patient presents with bilateral eye redness and irritation that started about 3 days ago.  Patient reports that he had some purulent drainage from the right eye.  He denies any trauma or foreign body to the eye.  Denies any vision changes.  Patient wears glasses but does not wear contacts.  Denies any associated nasal congestion that is different from baseline.  Denies history of any chronic eye problems.  Denies any known sick contacts.   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 Authorizing Provider  erythromycin ophthalmic ointment Place a 1/2 inch ribbon of ointment into the lower eyelid 4 times daily for 7 days. 08/08/22  Yes Jeffie Spivack, Hildred Alamin E, FNP  loratadine (CLARITIN) 10 MG tablet Take 1 tablet (10 mg total) by mouth daily. 08/08/22  Yes Denys Salinger, Hildred Alamin E, FNP  NEURONTIN 800 MG tablet TAKE 1 TABLET BY MOUTH THREE TIMES A DAY 02/09/22   Suzzanne Cloud, NP  TEGRETOL-XR 200 MG 12 hr tablet Take 1 tablet (200 mg total) by mouth 2 (two) times daily. 10/06/21   Suzzanne Cloud, NP  TEGRETOL-XR 400 MG 12 hr tablet Take 1 tablet (400 mg total) by mouth 2 (two) times daily. 10/06/21   Suzzanne Cloud, NP    Family History Family History  Problem Relation Age of Onset   Breast cancer Mother    Cancer  Father     Social History Social History   Tobacco Use   Smoking status: Never   Smokeless tobacco: Never  Substance Use Topics   Alcohol use: No   Drug use: No     Allergies   Dilantin [phenytoin sodium extended]   Review of Systems Review of Systems Per HPI  Physical Exam Triage Vital Signs ED Triage Vitals  Enc Vitals Group     BP 08/08/22 1545 (!) 146/86     Pulse Rate 08/08/22 1545 87     Resp 08/08/22 1545 18     Temp 08/08/22 1545 98.1 F (36.7 C)     Temp Source 08/08/22 1545 Oral     SpO2 08/08/22 1545 98 %     Weight --      Height --      Head Circumference --      Peak Flow --      Pain Score 08/08/22 1546 0     Pain Loc --      Pain Edu? --      Excl. in [place]? --    No data found.  Updated Vital Signs BP (!) 146/86 (BP Location: Left Arm)   Pulse 87   Temp 98.1 F (36.7 C) (Oral)   Resp 18  SpO2 98%   Visual Acuity Right Eye Distance:   Left Eye Distance:   Bilateral Distance:    Right Eye Near:   Left Eye Near:    Bilateral Near:     Physical Exam Constitutional:      General: He is not in acute distress.    Appearance: Normal appearance. He is not toxic-appearing or diaphoretic.  HENT:     Head: Normocephalic and atraumatic.  Eyes:     General: Lids are normal. Lids are everted, no foreign bodies appreciated. Vision grossly intact. Gaze aligned appropriately.     Extraocular Movements: Extraocular movements intact.     Conjunctiva/sclera:     Right eye: Right conjunctiva is injected. Chemosis present. No exudate or hemorrhage.    Left eye: Left conjunctiva is injected. No chemosis, exudate or hemorrhage.    Pupils: Pupils are equal, round, and reactive to light.  Pulmonary:     Effort: Pulmonary effort is normal.  Neurological:     General: No focal deficit present.     Mental Status: He is alert and oriented to person, place, and time. Mental status is at baseline.  Psychiatric:        Mood and Affect: Mood normal.         Behavior: Behavior normal.        Thought Content: Thought content normal.        Judgment: Judgment normal.      UC Treatments / Results  Labs (all labs ordered are listed, but only abnormal results are displayed) Labs Reviewed - No data to display  EKG   Radiology No results found.  Procedures Procedures (including critical care time)  Medications Ordered in UC Medications - No data to display  Initial Impression / Assessment and Plan / UC Course  I have reviewed the triage vital signs and the nursing notes.  Pertinent labs & imaging results that were available during my care of the patient were reviewed by me and considered in my medical decision making (see chart for details).     Differential diagnoses include bacterial conjunctivitis versus allergic conjunctivitis.  Given patient's report of purulent drainage, will treat with erythromycin ointment.  I also think patient would benefit from antihistamines so Claritin was prescribed as patient denies that he takes any daily antihistamines.  Patient does take gabapentin but I do think that benefits of antihistamine use for a few days outweigh the risks.  Advised patient to follow-up with ophthalmology at provided contact information if symptoms persist or worsen.  Visual acuity appears normal.  Patient verbalized understanding and was agreeable with plan. Final Clinical Impressions(s) / UC Diagnoses   Final diagnoses:  Bacterial conjunctivitis of both eyes     Discharge Instructions      It appears that you have a bacterial infection of your eyes.  I have prescribed an antibiotic ointment and an antihistamine.  Follow-up with eye doctor if symptoms persist or worsen.    ED Prescriptions     Medication Sig Dispense Auth. Provider   erythromycin ophthalmic ointment Place a 1/2 inch ribbon of ointment into the lower eyelid 4 times daily for 7 days. 3.5 g Oswaldo Conroy E, [place]   loratadine (CLARITIN) 10 MG tablet Take 1  tablet (10 mg total) by mouth daily. 10 tablet Teodora Medici, [place]      PDMP not reviewed this encounter.   Teodora Medici, [place] 08/08/22 [phone]

## 2022-08-08 NOTE — Discharge Instructions (Signed)
It appears that you have a bacterial infection of your eyes.  I have prescribed an antibiotic ointment and an antihistamine.  Follow-up with eye doctor if symptoms persist or worsen.

## 2022-08-14 ENCOUNTER — Other Ambulatory Visit: Payer: Self-pay | Admitting: Neurology

## 2022-08-16 ENCOUNTER — Telehealth: Payer: Self-pay | Admitting: Neurology

## 2022-08-16 NOTE — Telephone Encounter (Signed)
Pt called needing to speak to the RN regarding his ins not covering his NEURONTIN 800 MG tablet. Please advise.

## 2022-08-17 ENCOUNTER — Other Ambulatory Visit (HOSPITAL_COMMUNITY): Payer: Self-pay

## 2022-08-17 NOTE — Telephone Encounter (Signed)
Pharmacy Patient Advocate Encounter   Received notification from [place] that prior authorization for Neurontin '800MG'$  tablets is required/requested.   PA submitted on 2/2*/2024 to (ins) [employer] via CoverMyMeds Key or (Medicaid) confirmation # [id number] Status is pending

## 2022-08-17 NOTE — Telephone Encounter (Signed)
Please call and make patient  aware that PA's are taking around 10 business days for turn around. Please let him know if he has not heard by then, to reach back out. We just sent the PA to the team yesterday

## 2022-08-17 NOTE — Telephone Encounter (Signed)
Pt called checking on status of PA for Neurontin

## 2022-08-17 NOTE — Telephone Encounter (Signed)
Please obtain PA for Neurontin 800 MG tablet.

## 2022-08-21 NOTE — Telephone Encounter (Signed)
Pt called back and I informed him that  PA's are taking around 10 business days for turn around. Please let him know if he has not heard by then, to reach back out. We just sent the PA to the team yesterday, Pt said okay and thank you for calling.

## 2022-08-21 NOTE — Telephone Encounter (Signed)
Called pt and request he give the office a call.

## 2022-08-24 ENCOUNTER — Emergency Department (HOSPITAL_COMMUNITY)
Admission: EM | Admit: 2022-08-24 | Discharge: 2022-08-24 | Disposition: A | Discharge status: Home / Self Care | Payer: No Typology Code available for payment source | Attending: Emergency Medicine | Admitting: Emergency Medicine

## 2022-08-24 ENCOUNTER — Encounter (HOSPITAL_COMMUNITY): Payer: Self-pay | Admitting: *Deleted

## 2022-08-24 ENCOUNTER — Other Ambulatory Visit: Payer: Self-pay

## 2022-08-24 DIAGNOSIS — Z1152 Encounter for screening for COVID-19: Secondary | ICD-10-CM | POA: Diagnosis not present

## 2022-08-24 DIAGNOSIS — J101 Influenza due to other identified influenza virus with other respiratory manifestations: Secondary | ICD-10-CM | POA: Diagnosis not present

## 2022-08-24 DIAGNOSIS — J069 Acute upper respiratory infection, unspecified: Secondary | ICD-10-CM | POA: Diagnosis present

## 2022-08-24 LAB — RESP PANEL BY RT-PCR (RSV, FLU A&B, COVID)  RVPGX2
Influenza A by PCR: POSITIVE — AB
Influenza B by PCR: NEGATIVE
Resp Syncytial Virus by PCR: NEGATIVE
SARS Coronavirus 2 by RT PCR: NEGATIVE

## 2022-08-24 NOTE — Discharge Instructions (Addendum)
You were evaluated in the Emergency Department and after careful evaluation, we did not find any emergent condition requiring admission or further testing in the hospital.  Your exam/testing today was overall reassuring.  Follow-up on your COVID flu and RSV testing on MyChart.  Tylenol and Motrin for pain.  Please return to the Emergency Department if you experience any worsening of your condition.  Thank you for allowing [place] to be a part of your care.

## 2022-08-24 NOTE — Telephone Encounter (Signed)
Called pt and informed him that  Neurontin '800MG'$  tablets was approved and he should be able to pick up at the pharmacy.

## 2022-08-24 NOTE — ED Triage Notes (Signed)
The pt reports that his nose has been running all day and this afternoon he had a low grade temp  he did not take a fever reducer

## 2022-08-24 NOTE — ED Provider Notes (Signed)
[facility] Emergency Department Provider Note MRN:  [id number]  Arrival date & time: 08/24/22     Chief Complaint   URI   History of Present Illness   [name] [name] is a 52 y.o. year-old male with a history of seizure disorder presenting to the ED with chief complaint of URI.  Runny nose today.  Caught a chill today at home.  Here for evaluation.  No cough, temperature up to 100 at home.  No chest pain or shortness of breath, no abdominal pain, no vomiting or diarrhea.  Review of Systems  A thorough review of systems was obtained and all systems are negative except as noted in the HPI and PMH.   Patient's Health History    Past Medical History:  Diagnosis Date   Seizures ([place])     Past Surgical History:  Procedure Laterality Date   CERVICAL SPINE SURGERY     CHEST SURGERY     NECK SURGERY      Family History  Problem Relation Age of Onset   Breast cancer Mother    Cancer Father     Social History   Socioeconomic History   Marital status: Single    Spouse name: Not on file   Number of children: Not on file   Years of education: Not on file   Highest education level: Not on file  Occupational History   Not on file  Tobacco Use   Smoking status: Never   Smokeless tobacco: Never  Substance and Sexual Activity   Alcohol use: No   Drug use: No   Sexual activity: Not on file  Other Topics Concern   Not on file  Social History Narrative   Patient lives at home with his parents.    Right handed   12 th grade   3 or more sodas daily   Social Determinants of Health   Financial Resource Strain: Not on file  Food Insecurity: Not on file  Transportation Needs: Not on file  Physical Activity: Not on file  Stress: Not on file  Social Connections: Not on file  Intimate Partner Violence: Not on file     Physical Exam   Vitals:   08/24/22 0053  BP: (!) 144/78  Pulse: (!) 112  Resp: (!) 22  Temp: 99.6 F (37.6 C)  SpO2: 96%     CONSTITUTIONAL: Well-appearing, NAD NEURO/PSYCH:  Alert and oriented x 3, no focal deficits EYES:  eyes equal and reactive ENT/NECK:  no LAD, no JVD CARDIO: Regular rate, well-perfused, normal S1 and S2 PULM:  CTAB no wheezing or rhonchi GI/GU:  non-distended, non-tender MSK/SPINE:  No gross deformities, no edema SKIN:  no rash, atraumatic   *Additional and/or pertinent findings included in MDM below  Diagnostic and Interventional Summary    EKG Interpretation  Date/Time:    Ventricular Rate:    PR Interval:    QRS Duration:   QT Interval:    QTC Calculation:   R Axis:     Text Interpretation:         Labs Reviewed  RESP PANEL BY RT-PCR (RSV, FLU A&B, COVID)  [id number]    No orders to display    Medications - No data to display   Procedures  /  Critical Care Procedures  ED Course and Medical Decision Making  Initial Impression and Ddx Patient is well-appearing with no acute distress, clear lungs, no meningismus, some initial tachycardia in triage but on my assessment heart rate  in the 90s.  Looks well, nothing to suggest emergent process, minimal URI symptoms.  Appropriate for discharge.  Past medical/surgical history that increases complexity of ED encounter: None  Interpretation of Diagnostics COVID and flu testing pending  Patient Reassessment and Ultimate Disposition/Management     Discharge  Patient management required discussion with the following services or consulting groups:  None  Complexity of Problems Addressed Acute complicated illness or Injury  Additional Data Reviewed and Analyzed Further history obtained from: None  Additional Factors Impacting ED Encounter Risk None  Barth Kirks. [place] Small, [place] [email or web address]  Final Clinical Impressions(s) / ED Diagnoses     ICD-10-CM   1. Upper respiratory tract infection, unspecified type  J06.9       ED Discharge Orders     None         Discharge Instructions Discussed with and Provided to Patient:    Discharge Instructions      You were evaluated in the Emergency Department and after careful evaluation, we did not find any emergent condition requiring admission or further testing in the hospital.  Your exam/testing today was overall reassuring.  Follow-up on your COVID flu and RSV testing on MyChart.  Tylenol and Motrin for pain.  Please return to the Emergency Department if you experience any worsening of your condition.  Thank you for allowing [place] to be a part of your care.       Maudie Flakes, MD 08/24/22 [phone]

## 2022-08-24 NOTE — Telephone Encounter (Signed)
PA Approved

## 2022-09-07 ENCOUNTER — Other Ambulatory Visit: Payer: Self-pay | Admitting: Neurology

## 2022-09-08 ENCOUNTER — Other Ambulatory Visit: Payer: Self-pay | Admitting: Neurology

## 2022-09-11 NOTE — Telephone Encounter (Signed)
Routing to provider to review Returned call to pt who stated that he is agreeable to [address]. I have pended the refill with the correct pharmacy because I received a hard stop when I went to refill: Allergy/Contraindication: carbamazepineReactions: Rash. Reaction type: Allergy. User documented allergy severity: High. Cross-sensitive Class Match with PHENYTOIN SODIUM EXTENDED (Class: HYDANTOINS). Details Override reason     carbamazepine (TEGRETOL XR) 400 MG 12 hr tablet [Pharmacy Med Name: CARBAMAZEPINE ER 400 MG TABLET] Prescription. Reordered. Long-term. High Duplicate Medication/Therapy: Lorna Dibble Details

## 2022-09-12 MED ORDER — TEGRETOL-XR 400 MG PO TB12: 400.0000 mg | ORAL_TABLET | Freq: Two times a day (BID) | ORAL | 11 refills | Status: DC

## 2022-09-12 NOTE — Telephone Encounter (Signed)
Meds ordered this encounter  Medications   TEGRETOL-XR 400 MG 12 hr tablet    Sig: Take 1 tablet (400 mg total) by mouth 2 (two) times daily.    Dispense:  60 tablet    Refill:  11   Sent to [facility].

## 2022-09-13 ENCOUNTER — Telehealth: Payer: Self-pay | Admitting: Neurology

## 2022-09-13 ENCOUNTER — Other Ambulatory Visit: Payer: Self-pay

## 2022-09-13 MED ORDER — TEGRETOL-XR 200 MG PO TB12: 200.0000 mg | ORAL_TABLET | Freq: Two times a day (BID) | ORAL | 0 refills | Status: DC

## 2022-09-13 NOTE — Telephone Encounter (Signed)
Called pt. Informed him of message nurse Katie sent,  One year supply was sent and confirmed by pharmacy yesterday. Please have patient reach out to them  Pt said okay, I will call them. Pt then stated he needs a refill on NEURONTIN 800 MG tablet TEGRETOL-XR 200 MG 12 hr tablet. [id number]

## 2022-09-13 NOTE — Telephone Encounter (Signed)
Pt called. Requesting refill on  TEGRETOL-XR 400 MG 12 hr tablet. [id number]

## 2022-09-13 NOTE — Telephone Encounter (Signed)
Called pt. Informed hime of message nurse Katie sent.  Patient has refill on the Neurontin, refilled the tegretol 200, but needs to keep appointment for further refills  Pt said okay. I'm coming to my appointment on 4/23

## 2022-10-10 ENCOUNTER — Ambulatory Visit (INDEPENDENT_AMBULATORY_CARE_PROVIDER_SITE_OTHER): Payer: No Typology Code available for payment source | Admitting: Neurology

## 2022-10-10 ENCOUNTER — Encounter: Payer: Self-pay | Admitting: Neurology

## 2022-10-10 VITALS — BP 150/82 | HR 91 | Ht 67.0 in | Wt 157.5 lb

## 2022-10-10 DIAGNOSIS — G40309 Generalized idiopathic epilepsy and epileptic syndromes, not intractable, without status epilepticus: Secondary | ICD-10-CM

## 2022-10-10 MED ORDER — TEGRETOL-XR 200 MG PO TB12: 200.0000 mg | ORAL_TABLET | Freq: Two times a day (BID) | ORAL | 11 refills | Status: DC

## 2022-10-10 NOTE — Progress Notes (Signed)
Patient: [name] [name] Date of Birth: Jun 19, 1971  Reason for Visit: follow up for seizures History From: patient Primary Neurologist: Dr. Teresa Coombs  HISTORY OF PRESENT ILLNESS: Today 10/10/22  Labs at last visit showed normal CBC, CMP.  Carbamazepine level 9.7, vitamin D low at 21.6, gabapentin level 10.6. he remains on brand name neurotonin and tegretol. Claims in the past when on generic of both had seizures. Is on on Vitamin D. Still working full time, living with his mom, his dad passed away this last year. He drives a car. He hasn't seen PCP in awhile.    Update 10/06/21 SS: [name] [name] is here today for follow-up.  Carbamazepine at last visit was 9.9, CBC and CMP were unremarkable. Last seizure was more than 10 years ago. Seizure have been generalized unaware.  Requires brand-name Tegretol and Neurontin.  Reports in the past when he has tried generic he has had seizures.  Lives with his parents.  Drives a car. Has a job sanding signs.  Does not see PCP regularly.  Denies any new problems or concerns.  Has a letter from his new insurance stating they will not cover brand-name Neurontin.  Update 10/06/2020 SS: [name] [name] is a 52 year old male with history of seizure disorder.  Has had seizures on generic patients in the past.  Remains on brand-name Tegretol XR, Neurontin. Tolerates medications without side effect.  Last seizure was about 10 years ago.  Denies any changes to his health.  He lives with his parents.  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  Here today for evaluation unaccompanied.  Update 10/07/2019 SS: [name] [name] is a 52 year old male with history of seizure disorder.  He has had seizures on generic medications in the past.  He is currently taking brand name Tegretol XR 400 mg twice a day, Tegretol XR 200 mg twice daily, Neurontin 800 mg 3 times a day. He thinks his last seizure was in 2010.  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  He drives a car  without difficulty.  He denies any new problems or concerns.  He presents today for evaluation unaccompanied.  HISTORY  10/07/2018 SS: [name] [name] is a 52 year old male with a history of seizure disorder.  He has had seizures on generic medications in the past.  He is currently taking Tegretol-XR 400 mg twice daily, Tegretol 200 mg XR, twice daily, Neurontin 800 mg 3 times a day.  He denies any recent seizures.  He reports his last seizure was several years ago.  He is currently living with her parents, working full-time sanding signs.  He is driving a car without difficulty.  He denies any falls or troubles with balance.  He denies any new problems or concerns.  He denies any changes to his medications.  REVIEW OF SYSTEMS: Out of a complete 14 system review of symptoms, the patient complains only of the following symptoms, and all other reviewed systems are negative.  See HPI  ALLERGIES: Allergies  Allergen Reactions   Dilantin [Phenytoin Sodium Extended] Rash    HOME MEDICATIONS: Outpatient Medications Prior to Visit  Medication Sig Dispense Refill   NEURONTIN 800 MG tablet TAKE 1 TABLET BY MOUTH THREE TIMES A DAY 90 tablet 1   TEGRETOL-XR 200 MG 12 hr tablet Take 1 tablet (200 mg total) by mouth 2 (two) times daily. 60 tablet 0   TEGRETOL-XR 400 MG 12 hr tablet Take 1 tablet (400 mg total) by mouth 2 (two)  times daily. 60 tablet 11   erythromycin ophthalmic ointment Place a 1/2 inch ribbon of ointment into the lower eyelid 4 times daily for 7 days. 3.5 g 0   loratadine (CLARITIN) 10 MG tablet Take 1 tablet (10 mg total) by mouth daily. 10 tablet 0   No facility-administered medications prior to visit.    PAST MEDICAL HISTORY: Past Medical History:  Diagnosis Date   Seizures     PAST SURGICAL HISTORY: Past Surgical History:  Procedure Laterality Date   CERVICAL SPINE SURGERY     CHEST SURGERY     NECK SURGERY      FAMILY HISTORY: Family History  Problem Relation Age of  Onset   Breast cancer Mother    Cancer Father     SOCIAL HISTORY: Social History   Socioeconomic History   Marital status: Single    Spouse name: Not on file   Number of children: Not on file   Years of education: Not on file   Highest education level: Not on file  Occupational History   Not on file  Tobacco Use   Smoking status: Never   Smokeless tobacco: Never  Substance and Sexual Activity   Alcohol use: No   Drug use: No   Sexual activity: Not on file  Other Topics Concern   Not on file  Social History Narrative   Patient lives at home with his parents.    Right handed   12 th grade   3 or more sodas daily   Social Determinants of Health   Financial Resource Strain: Not on file  Food Insecurity: Not on file  Transportation Needs: Not on file  Physical Activity: Not on file  Stress: Not on file  Social Connections: Not on file  Intimate Partner Violence: Not on file   PHYSICAL EXAM  Vitals:   10/10/22 1308  BP: (!) 150/82  Pulse: 91  Weight: 157 lb 8 oz (71.4 kg)  Height:  (1.702 m)   Body mass index is 24.67 kg/m.  Generalized: Well developed, in no acute distress  Neurological examination  Mentation: Alert oriented to time, place, history taking. Follows all commands speech and language fluent Cranial nerve II-XII: Pupils were equal round reactive to light. Extraocular movements were full, visual field were full on confrontational test. Facial sensation and strength were normal.  Head turning and shoulder shrug  were normal and symmetric. Motor: The motor testing reveals 5 over 5 strength of all 4 extremities. Good symmetric motor tone is noted throughout.  Sensory: Sensory testing is intact to soft touch on all 4 extremities. No evidence of extinction is noted.  Coordination: Cerebellar testing reveals good finger-nose-finger and heel-to-shin bilaterally.  Gait and station: Gait is normal. Tandem gait is slightly unsteady.   DIAGNOSTIC DATA  (LABS, IMAGING, TESTING) - I reviewed patient records, labs, notes, testing and imaging myself where available.  Lab Results  Component Value Date   WBC 4.0 10/06/2021   HGB 17.4 10/06/2021   HCT 50.6 10/06/2021   MCV 87 10/06/2021   PLT 242 10/06/2021      Component Value Date/Time   NA 140 10/06/2021 1317   K 4.1 10/06/2021 1317   CL 100 10/06/2021 1317   CO2 28 10/06/2021 1317   GLUCOSE 77 10/06/2021 1317   GLUCOSE 118 (H) 10/13/2019 1530   BUN 10 10/06/2021 1317   CREATININE 0.79 10/06/2021 1317   CALCIUM 9.3 10/06/2021 1317   PROT 6.9 10/06/2021 1317   ALBUMIN  4.5 10/06/2021 1317   AST 20 10/06/2021 1317   ALT 19 10/06/2021 1317   ALKPHOS 82 10/06/2021 1317   BILITOT 0.3 10/06/2021 1317   GFRNONAA >60 10/13/2019 1530   GFRAA >60 10/13/2019 1530   No results found for: "CHOL", "HDL", "LDLCALC", "LDLDIRECT", "TRIG", "CHOLHDL" No results found for: "HGBA1C" Lab Results  Component Value Date   VITAMINB12 688 02/06/2006   No results found for: "TSH"   ASSESSMENT AND PLAN 52 y.o. year old male  has a past medical history of Seizures. here with:  1.  Seizure disorder, well-controlled  -He wishes to continue on current medication regimen, every year we have to work with his insurance to get approval for brand-name Neurontin and Tegretol.  He indicates he has tried generic in the past and has had breakthrough seizures -Check routine labs today including vitamin D -He needs to see his primary care doctor for routine physical -Last seizure was over 10 years ago -He has a generic email from his HR department requesting a prior authorization letter, but no mention of if for Neurontin or Tegretol.  Back in March Neurontin was approved through March 2025, letter in the chart -Follow-up in 1 year with Dr. Teresa Coombs to establish care and discuss long term seizure medication regimen  Margie Ege, AGNP-C, DNP 10/10/2022, 1:31 PM [facility] [address] [place], [place] [address] [phone]

## 2022-10-10 NOTE — Patient Instructions (Signed)
Check labs today Please see pcp about getting a physical  No medication changes

## 2022-10-11 LAB — VITAMIN D 25 HYDROXY (VIT D DEFICIENCY, FRACTURES): Vit D, 25-Hydroxy: 22.3 ng/mL — ABNORMAL LOW (ref 30.0–100.0)

## 2022-10-11 LAB — CBC WITH DIFFERENTIAL/PLATELET
Basophils Absolute: 0 10*3/uL (ref 0.0–0.2)
Basos: 1 %
EOS (ABSOLUTE): 0.3 10*3/uL (ref 0.0–0.4)
Eos: 8 %
Hematocrit: 47.8 % (ref 37.5–51.0)
Hemoglobin: 16.3 g/dL (ref 13.0–17.7)
Immature Grans (Abs): 0 10*3/uL (ref 0.0–0.1)
Immature Granulocytes: 0 %
Lymphocytes Absolute: 0.7 10*3/uL (ref 0.7–3.1)
Lymphs: 18 %
MCH: 29.9 pg (ref 26.6–33.0)
MCHC: 34.1 g/dL (ref 31.5–35.7)
MCV: 88 fL (ref 79–97)
Monocytes Absolute: 0.4 10*3/uL (ref 0.1–0.9)
Monocytes: 10 %
Neutrophils Absolute: 2.4 10*3/uL (ref 1.4–7.0)
Neutrophils: 63 %
Platelets: 276 10*3/uL (ref 150–450)
RBC: 5.45 x10E6/uL (ref 4.14–5.80)
RDW: 12.3 % (ref 11.6–15.4)
WBC: 3.8 10*3/uL (ref 3.4–10.8)

## 2022-10-11 LAB — COMPREHENSIVE METABOLIC PANEL
ALT: 16 IU/L (ref 0–44)
AST: 19 IU/L (ref 0–40)
Albumin/Globulin Ratio: 1.6 (ref 1.2–2.2)
Albumin: 4.6 g/dL (ref 4.1–5.1)
Alkaline Phosphatase: 103 IU/L (ref 44–121)
BUN/Creatinine Ratio: 12 (ref 9–20)
BUN: 9 mg/dL (ref 6–24)
Bilirubin Total: 0.3 mg/dL (ref 0.0–1.2)
CO2: 25 mmol/L (ref 20–29)
Calcium: 9.3 mg/dL (ref 8.7–10.2)
Chloride: 101 mmol/L (ref 96–106)
Creatinine, Ser: 0.78 mg/dL (ref 0.76–1.27)
Globulin, Total: 2.8 g/dL (ref 1.5–4.5)
Glucose: 89 mg/dL (ref 70–99)
Potassium: 4.4 mmol/L (ref 3.5–5.2)
Sodium: 141 mmol/L (ref 134–144)
Total Protein: 7.4 g/dL (ref 6.0–8.5)
eGFR: 109 mL/min/{1.73_m2} (ref >59)

## 2022-10-11 LAB — CARBAMAZEPINE LEVEL, TOTAL: Carbamazepine (Tegretol), S: 11.4 ug/mL (ref 4.0–12.0)

## 2022-10-12 ENCOUNTER — Telehealth: Payer: Self-pay

## 2022-10-12 NOTE — Telephone Encounter (Signed)
Left msg for pt to call back to obtain result 

## 2022-10-12 NOTE — Telephone Encounter (Signed)
-----   Message from Glean Salvo, NP sent at 10/11/2022 12:49 PM EDT ----- Please call, blood work is fine with the exception of a slightly low vitamin D level 22.3.  Would recommend starting over-the-counter vitamin D 1000 units daily.  Thanks, Maralyn Sago

## 2022-10-16 ENCOUNTER — Other Ambulatory Visit: Payer: Self-pay | Admitting: Neurology

## 2022-10-20 ENCOUNTER — Ambulatory Visit
Admission: EM | Admit: 2022-10-20 | Discharge: 2022-10-20 | Disposition: A | Discharge status: Home / Self Care | Payer: No Typology Code available for payment source | Attending: Internal Medicine | Admitting: Internal Medicine

## 2022-10-20 ENCOUNTER — Ambulatory Visit (INDEPENDENT_AMBULATORY_CARE_PROVIDER_SITE_OTHER): Payer: No Typology Code available for payment source

## 2022-10-20 DIAGNOSIS — R2242 Localized swelling, mass and lump, left lower limb: Secondary | ICD-10-CM | POA: Diagnosis not present

## 2022-10-20 NOTE — ED Triage Notes (Signed)
Pt presents UC with a medium sized bump on his left shin that is painful x 3 weeks.

## 2022-10-20 NOTE — ED Provider Notes (Signed)
[facility]    CSN: [id number] Arrival date & time: 10/20/22  1459      History   Chief Complaint No chief complaint on file.   HPI [name] [name] is a 52 y.o. male.   Patient presents with area of swelling present to left upper shin that he noticed about 3 weeks ago.  He denies any falls or injury to the area.  Reports it is minimally painful but not itchy.  Has not taken any medications for the symptoms.  Has not applied anything topically either.     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 Authorizing Provider  NEURONTIN 800 MG tablet TAKE 1 TABLET BY MOUTH THREE TIMES A DAY 10/16/22   Glean Salvo, NP  TEGRETOL-XR 200 MG 12 hr tablet Take 1 tablet (200 mg total) by mouth 2 (two) times daily. 10/10/22   Glean Salvo, NP  TEGRETOL-XR 400 MG 12 hr tablet Take 1 tablet (400 mg total) by mouth 2 (two) times daily. 09/12/22   Glean Salvo, NP    Family History Family History  Problem Relation Age of Onset   Breast cancer Mother    Cancer Father     Social History Social History   Tobacco Use   Smoking status: Never   Smokeless tobacco: Never  Substance Use Topics   Alcohol use: No   Drug use: No     Allergies   Dilantin [phenytoin sodium extended]   Review of Systems Review of Systems Per HPI  Physical Exam Triage Vital Signs ED Triage Vitals  Enc Vitals Group     BP 10/20/22 1511 128/88     Pulse Rate 10/20/22 1511 85     Resp 10/20/22 1511 18     Temp 10/20/22 1511 97.9 F  (36.6 C)     Temp Source 10/20/22 1511 Oral     SpO2 10/20/22 1511 95 %     Weight --      Height --      Head Circumference --      Peak Flow --      Pain Score 10/20/22 1515 0     Pain Loc --      Pain Edu? --      Excl. in GC? --    No data found.  Updated Vital Signs BP 128/88 (BP Location: Left Arm)   Pulse 85   Temp 97.9 F (36.6 C) (Oral)   Resp 18   SpO2 95%   Visual Acuity Right Eye Distance:   Left Eye Distance:   Bilateral Distance:    Right Eye Near:   Left Eye Near:    Bilateral Near:     Physical Exam Constitutional:      General: He is not in acute distress.    Appearance: Normal appearance. He is not toxic-appearing or diaphoretic.  HENT:     Head: Normocephalic and atraumatic.  Eyes:  Extraocular Movements: Extraocular movements intact.     Conjunctiva/sclera: Conjunctivae normal.  Pulmonary:     Effort: Pulmonary effort is normal.  Musculoskeletal:       Legs:     Comments: Patient has approximately 2 inch diameter area of the swelling that is flesh-colored present directly below patella to the upper tib/fib.  No lacerations or abrasions noted.  Full range of motion of leg present.  Neurovascularly intact  Neurological:     General: No focal deficit present.     Mental Status: He is alert and oriented to person, place, and time. Mental status is at baseline.  Psychiatric:        Mood and Affect: Mood normal.        Behavior: Behavior normal.        Thought Content: Thought content normal.        Judgment: Judgment normal.      UC Treatments / Results  Labs (all labs ordered are listed, but only abnormal results are displayed) Labs Reviewed - No data to display  EKG   Radiology DG Tibia/Fibula Left  Result Date: 10/20/2022 CLINICAL DATA:  Left leg pain for 3 weeks. EXAM: LEFT TIBIA AND FIBULA - 2 VIEW COMPARISON:  None Available. FINDINGS: There is no evidence of fracture or other focal bone lesions. Soft tissues are  unremarkable. IMPRESSION: Negative. Electronically Signed   By: Lupita Raider M.D.   On: 10/20/2022 15:39    Procedures Procedures (including critical care time)  Medications Ordered in UC Medications - No data to display  Initial Impression / Assessment and Plan / UC Course  I have reviewed the triage vital signs and the nursing notes.  Pertinent labs & imaging results that were available during my care of the patient were reviewed by me and considered in my medical decision making (see chart for details).     I am not sure exact etiology of patient's leg swelling.  Although, there are no signs of infection or DVT.  Area of localized swelling which could be a lipoma versus simple inflammation.  Recommended to patient that he see family medicine for more advanced imaging such as ultrasound or MRI which cannot be performed here in urgent care.  X-ray was negative for any acute bony abnormality.  Recommended ice application to decrease inflammation.  Patient verbalized understanding and was agreeable with plan. Final Clinical Impressions(s) / UC Diagnoses   Final diagnoses:  Localized swelling of left lower leg     Discharge Instructions      X-ray was normal.  Recommend ice application and following up with family medicine doctor for any more advanced imaging.     ED Prescriptions   None    PDMP not reviewed this encounter.   Gustavus Bryant, [place] 10/20/22 [phone]

## 2022-10-20 NOTE — Discharge Instructions (Signed)
X-ray was normal.  Recommend ice application and following up with family medicine doctor for any more advanced imaging.

## 2022-10-30 MED ORDER — TEGRETOL-XR 200 MG PO TB12: 200.0000 mg | ORAL_TABLET | Freq: Two times a day (BID) | ORAL | 11 refills | Status: DC

## 2022-10-30 NOTE — Addendum Note (Signed)
Addended by: Danne Harbor on: 10/30/2022 02:34 PM   Modules accepted: Orders

## 2022-10-31 ENCOUNTER — Other Ambulatory Visit: Payer: Self-pay

## 2022-10-31 NOTE — Progress Notes (Signed)
Error

## 2022-12-18 ENCOUNTER — Other Ambulatory Visit: Payer: Self-pay | Admitting: Neurology

## 2023-02-20 ENCOUNTER — Other Ambulatory Visit: Payer: Self-pay | Admitting: Neurology

## 2023-04-16 ENCOUNTER — Other Ambulatory Visit: Payer: Self-pay | Admitting: Neurology

## 2023-04-16 NOTE — Telephone Encounter (Signed)
Rx refilled per last office visit note.

## 2023-06-18 ENCOUNTER — Other Ambulatory Visit: Payer: Self-pay | Admitting: Neurology

## 2023-08-13 ENCOUNTER — Other Ambulatory Visit: Payer: Self-pay | Admitting: Neurology

## 2023-09-04 ENCOUNTER — Other Ambulatory Visit (HOSPITAL_COMMUNITY): Payer: Self-pay

## 2023-09-04 ENCOUNTER — Telehealth: Payer: Self-pay

## 2023-09-04 NOTE — Telephone Encounter (Signed)
 Pharmacy Patient Advocate Encounter   Received notification from Fax that prior authorization for Neurontin 800MG  Tablet is required/requested.   Insurance verification completed.   The patient is insured through  [employer]  .   Per test claim: PA required; PA submitted to above mentioned insurance via Fax Key/confirmation #/EOC N/A Status is pending

## 2023-09-12 NOTE — Telephone Encounter (Signed)
 Pharmacy Patient Advocate Encounter  Received notification from  Demetrio Lapping  that Prior Authorization for Gabapentin 800mg   has been APPROVED from 09/12/2023 to 03/14/2025   PA #/Case ID/Reference #: [id number]

## 2023-10-04 ENCOUNTER — Other Ambulatory Visit: Payer: Self-pay

## 2023-10-04 MED ORDER — TEGRETOL-XR 400 MG PO TB12: 400.0000 mg | ORAL_TABLET | Freq: Two times a day (BID) | ORAL | 11 refills | Status: AC

## 2023-10-10 ENCOUNTER — Ambulatory Visit: Payer: No Typology Code available for payment source | Admitting: Neurology

## 2023-10-15 ENCOUNTER — Emergency Department (HOSPITAL_COMMUNITY)

## 2023-10-15 ENCOUNTER — Encounter (HOSPITAL_COMMUNITY): Payer: Self-pay

## 2023-10-15 ENCOUNTER — Other Ambulatory Visit: Payer: Self-pay

## 2023-10-15 ENCOUNTER — Emergency Department (HOSPITAL_COMMUNITY)
Admission: EM | Admit: 2023-10-15 | Discharge: 2023-10-15 | Disposition: A | Discharge status: Home / Self Care | Attending: Emergency Medicine | Admitting: Emergency Medicine

## 2023-10-15 DIAGNOSIS — M79652 Pain in left thigh: Secondary | ICD-10-CM | POA: Insufficient documentation

## 2023-10-15 DIAGNOSIS — M5126 Other intervertebral disc displacement, lumbar region: Secondary | ICD-10-CM | POA: Diagnosis not present

## 2023-10-15 MED ORDER — ACETAMINOPHEN 500 MG PO TABS
1000.0000 mg | ORAL_TABLET | Freq: Once | ORAL | Status: AC
  Administered 2023-10-15: 1000 mg via ORAL
  Filled 2023-10-15: qty 2

## 2023-10-15 MED ORDER — LIDOCAINE 5 % EX PTCH
1.0000 | MEDICATED_PATCH | CUTANEOUS | Status: DC
  Administered 2023-10-15: 1 via TRANSDERMAL
  Filled 2023-10-15: qty 1

## 2023-10-15 MED ORDER — LIDOCAINE 5 % EX PTCH: 1.0000 | MEDICATED_PATCH | CUTANEOUS | 0 refills | Status: AC

## 2023-10-15 NOTE — Discharge Instructions (Addendum)
 Please follow-up with the neurosurgeon in regards to recent symptoms and ER visit along with your primary care provider.  Today your CT scan shows you have some disc herniation that could be contributing to your symptoms.  Please use Tylenol  or ibuprofen every 6 hours as needed for pain.  Please return to the ER if your pain becomes excruciating or you start developing new numbness, weakness, urinary incontinence (peeing on self without warning), urinary retention (not able to pee despite bladder feeling full), inability to defecate, pins and needles sensation by your ano-genital area.

## 2023-10-15 NOTE — ED Provider Notes (Signed)
 [place] EMERGENCY DEPARTMENT AT [facility] Provider Note   CSN: [id number] Arrival date & time: 10/15/23  0636     History  Chief Complaint  Patient presents with   Leg Pain    [name] [name] is a 53 y.o. male history of epilepsy on neurontin  and tegretol  presented for left leg left back pain that began yesterday.  Patient is able to walk and denies saddle anesthesia or urinary or bowel incontinence, fevers, trauma, leg swelling or skin color changes or difficulty ambulating.  Patient states he has not had this before and has not tried any medications at this time.  Patient states the pain is across his low back and then has pain on the lateral aspect of his left thigh but that is a shooting pain that comes and goes without obvious trigger.  Home Medications Prior to Admission medications   Medication Sig Start Date End Date Taking? Authorizing Provider  lidocaine (LIDODERM) 5 % Place 1 patch onto the skin daily. Remove & Discard patch within 12 hours or as directed by MD 10/15/23  Yes Denese Finn, PA-C  NEURONTIN  800 MG tablet TAKE 1 TABLET BY MOUTH THREE TIMES A DAY 08/13/23   Wess Hammed, NP  TEGRETOL -XR 200 MG 12 hr tablet Take 1 tablet (200 mg total) by mouth 2 (two) times daily. 10/30/22   Wess Hammed, NP  TEGRETOL -XR 400 MG 12 hr tablet Take 1 tablet (400 mg total) by mouth 2 (two) times daily. 10/04/23   Wess Hammed, NP      Allergies    Dilantin [phenytoin sodium extended]    Review of Systems   Review of Systems  Physical Exam Updated Vital Signs BP (!) 142/89 (BP Location: Right Arm)   Pulse 96   Temp 98.4 F (36.9 C) (Oral)   Resp 16   Ht 5\' 7"  (1.702 m)   Wt 65.8 kg   SpO2 100%   BMI 22.71 kg/m  Physical Exam Vitals reviewed.  Constitutional:      General: He is not in acute distress. Cardiovascular:     Rate and Rhythm: Normal rate.     Pulses: Normal pulses.  Musculoskeletal:     Comments: 5 out of 5 bilateral hip  adduction/abduction, knee extension/flexion, plantarflexion is dorsiflexion Tenderness left IT band when I press 5 out of 5 bilateral hip flexion No pelvic tenderness Negative logroll Soft compartments Pain not out of proportion No midline tenderness or abnormalities palpated  Skin:    General: Skin is warm and dry.     Capillary Refill: Capillary refill takes less than 2 seconds.  Neurological:     Mental Status: He is alert.     Comments: Sensation intact distally 2+ bilateral patellar reflexes Able to ambulate without difficulty does endorse pain when ambulating  Psychiatric:        Mood and Affect: Mood normal.     ED Results / Procedures / Treatments   Labs (all labs ordered are listed, but only abnormal results are displayed) Labs Reviewed - No data to display  EKG None  Radiology CT Lumbar Spine Wo Contrast Result Date: 10/15/2023 CLINICAL DATA:  Low back pain and left leg pain beginning acutely. EXAM: CT LUMBAR SPINE WITHOUT CONTRAST TECHNIQUE: Multidetector CT imaging of the lumbar spine was performed without intravenous contrast administration. Multiplanar CT image reconstructions were also generated. RADIATION DOSE REDUCTION: This exam was performed according to the departmental dose-optimization program which includes automated exposure control,  adjustment of the mA and/or kV according to patient size and/or use of iterative reconstruction technique. COMPARISON:  Radiography 08/02/2010 FINDINGS: Segmentation: 5 lumbar type vertebral bodies. Alignment: Normal Vertebrae: No fracture or focal bone lesion. Chronic endplate Schmorl's nodes in the lower thoracic and upper lumbar region, not likely of acute relevance. Paraspinal and other soft tissues: Negative Disc levels: T12-L1: Chronic left paracentral disc protrusion without significant encroachment upon the canal or foramina. L1-2: Left paracentral disc herniation indents the thecal sac. Stenosis of the left lateral recess  could cause neural compression. L2-3: Mild bulging of the disc.  No compressive stenosis. L3-4: Broad-based disc herniation. Indentation of the thecal sac. Stenosis of both lateral recesses that could cause neural compression on either or both sides. Left foraminal to extraforaminal encroachment could focally affect the left L3 nerve. L4-5: Bulging of the disc. Mild facet and ligamentous hypertrophy. Mild lateral recess narrowing but no likely neural compression. L5-S1: Mild bulging of the disc.  No canal or foraminal stenosis. IMPRESSION: 1. L1-2: Left paracentral disc herniation indents the thecal sac. Stenosis of the left lateral recess could cause neural compression. 2. L3-4: Broad-based disc herniation. Indentation of the thecal sac. Stenosis of both lateral recesses that could cause neural compression on either or both sides. Left foraminal to extraforaminal encroachment could focally affect the left L3 nerve. 3. L4-5: Bulging of the disc. Mild facet and ligamentous hypertrophy. Mild lateral recess narrowing but no likely neural compression. 4. L5-S1: Mild bulging of the disc. No canal or foraminal stenosis. Electronically Signed   By: Bettylou Brunner M.D.   On: 10/15/2023 08:34    Procedures Procedures    Medications Ordered in ED Medications  lidocaine (LIDODERM) 5 % 1 patch (1 patch Transdermal Patch Applied 10/15/23 0739)  acetaminophen  (TYLENOL ) tablet 1,000 mg (1,000 mg Oral Given 10/15/23 1610)    ED Course/ Medical Decision Making/ A&P                                 Medical Decision Making Amount and/or Complexity of Data Reviewed Radiology: ordered.  Risk OTC drugs. Prescription drug management.   [name] 53 y.o. presented today for low back left leg pain. Working DDx that I considered at this time includes, but not limited to, contusion, strain/sprain, fracture, dislocation, neurovascular compromise, septic joint, ischemic limb, compartment syndrome, cauda equina,  degenerative disc disease, spinal stenosis, epidural abscess, disc herniation.  R/o DDx: contusion, strain/sprain, fracture, dislocation, neurovascular compromise, septic joint, ischemic limb, compartment syndrome, cauda equina,  epidural abscess: These are considered less likely due to history of present illness, physical exam, labs/imaging findings.  Review of prior external notes: 06/14/2016 office visit  Unique Tests and My Independent Interpretation:  Lumbar x-ray  Social Determinants of Health: none  Discussion with Independent Historian:  Family member  Discussion of Management of Tests: None  Risk: Medium: prescription drug management  Risk Stratification Score: None  Plan: On exam patient was no acute distress with stable vitals.  Patient is neurologically intact and neurovascularly intact and does have tenderness to his left IT band muscle.  Patient does have some mild tenderness in paralumbar musculature but no midline tenderness with me.  Negative straight leg test.  Patient able to ambulate has good bilateral patellar reflexes and so low suspicion of spinal cord pathology however will get lumbar x-ray to look for degenerative disc disease.  Will give Tylenol  and lidocaine patch as  patient did not try medications at this time.  Patient also did have tenderness in his IT band and so do think this is the root cause of his symptoms recommend that he follows up with orthopedics if it continues to bother him.  After discussion with the attending we will get a CT lumbar spine.  CT does show multiple points of disc herniation that would be contributing patient's symptoms along with some stenosis.  Patient at this time is able to ambulate is not endorsing any red flag symptoms and so I recommended that he takes Tylenol  or ibuprofen every 6 hours needed for pain and to follow-up with a neurosurgeon for further evaluation to which he agrees.  Patient is able to ambulate without any  difficulty and since he is not having any red flag symptoms does not need MRI at this point.  Patient was given return precautions. Patient stable for discharge at this time.  Patient verbalized understanding of plan.  This chart was dictated using voice recognition software.  Despite best efforts to proofread,  errors can occur which can change the documentation meaning.        Final Clinical Impression(s) / ED Diagnoses Final diagnoses:  Left thigh pain  Lumbar disc herniation    Rx / DC Orders ED Discharge Orders          Ordered    lidocaine (LIDODERM) 5 %  Every 24 hours        10/15/23 0746              Ravaughn, [place], PA-C 10/15/23 1610    Arvilla Birmingham, MD 10/15/23 [phone]

## 2023-10-15 NOTE — ED Triage Notes (Signed)
 Pt c/o left leg pain that started yesterday with lower back pain. Pt denies swelling, trauma, numbness, tingling, or bowel/bladder incontinence.

## 2023-10-16 ENCOUNTER — Other Ambulatory Visit: Payer: Self-pay | Admitting: Neurology

## 2023-10-17 ENCOUNTER — Encounter: Payer: Self-pay | Admitting: Neurology

## 2023-10-17 ENCOUNTER — Ambulatory Visit: Admitting: Neurology

## 2023-10-17 ENCOUNTER — Ambulatory Visit (INDEPENDENT_AMBULATORY_CARE_PROVIDER_SITE_OTHER): Admitting: Neurology

## 2023-10-17 VITALS — BP 136/78 | HR 80 | Ht 67.0 in | Wt 155.0 lb

## 2023-10-17 DIAGNOSIS — G40309 Generalized idiopathic epilepsy and epileptic syndromes, not intractable, without status epilepticus: Secondary | ICD-10-CM | POA: Diagnosis not present

## 2023-10-17 DIAGNOSIS — Z5181 Encounter for therapeutic drug level monitoring: Secondary | ICD-10-CM | POA: Diagnosis not present

## 2023-10-17 NOTE — Progress Notes (Signed)
 Patient: [name] [name] Date of Birth: 09-07-1970  Reason for Visit: follow up for seizures History From: patient Primary Neurologist: Dr. Samara Crest  HISTORY OF PRESENT ILLNESS: Today 10/17/23  Patient presents today for follow-up, last visit was a year ago since then, he has been doing well, denies any seizure or seizure like activity.  He is compliant with his medications, brand-name Tegretol  and Neurontin .  He would like to continue his medication, no changes at the moment. Tells me the Neurontin  cost about $700  but he met his deductible and medications should be free moving forward. Report hurting his back and and right leg this week. Using a walker with ambulation at the moment.    Update 10/10/2022 SS: Labs at last visit showed normal CBC, CMP.  Carbamazepine  level 9.7, vitamin D  low at 21.6, gabapentin  level 10.6. he remains on brand name neurotonin and tegretol . Claims in the past when on generic of both had seizures. Is on on Vitamin D . Still working full time, living with his mom, his dad passed away this last year. He drives a car. He hasn't seen PCP in awhile.    Update 10/06/21 SS: [name] is here today for follow-up.  Carbamazepine  at last visit was 9.9, CBC and CMP were unremarkable. Last seizure was more than 10 years ago. Seizure have been generalized unaware.  Requires brand-name Tegretol  and Neurontin .  Reports in the past when he has tried generic he has had seizures.  Lives with his parents.  Drives a car. Has a job sanding signs.  Does not see PCP regularly.  Denies any new problems or concerns.  Has a letter from his new insurance stating they will not cover brand-name Neurontin .  Update 10/06/2020 SS: [name] is a 53 year old male with history of seizure disorder.  Has had seizures on generic patients in the past.  Remains on brand-name Tegretol  XR, Neurontin . Tolerates medications without side effect.  Last seizure was about 10 years ago.  Denies any changes to his health.   He lives with his parents.  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  Here today for evaluation unaccompanied.  Update 10/07/2019 SS: [name] is a 53 year old male with history of seizure disorder.  He has had seizures on generic medications in the past.  He is currently taking brand name Tegretol  XR 400 mg twice a day, Tegretol  XR 200 mg twice daily, Neurontin  800 mg 3 times a day. He thinks his last seizure was in 2010.  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  He drives a car without difficulty.  He denies any new problems or concerns.  He presents today for evaluation unaccompanied.  HISTORY  10/07/2018 SS: [name] is a 53 year old male with a history of seizure disorder.  He has had seizures on generic medications in the past.  He is currently taking Tegretol -XR 400 mg twice daily, Tegretol  200 mg XR, twice daily, Neurontin  800 mg 3 times a day.  He denies any recent seizures.  He reports his last seizure was several years ago.  He is currently living with her parents, working full-time sanding signs.  He is driving a car without difficulty.  He denies any falls or troubles with balance.  He denies any new problems or concerns.  He denies any changes to his medications.  REVIEW OF SYSTEMS: Out of a complete 14 system review of symptoms, the patient complains only of the following symptoms, and all other reviewed  systems are negative.  See HPI  ALLERGIES: Allergies  Allergen Reactions   Dilantin [Phenytoin Sodium Extended] Rash    HOME MEDICATIONS: Outpatient Medications Prior to Visit  Medication Sig Dispense Refill   lidocaine (LIDODERM) 5 % Place 1 patch onto the skin daily. Remove & Discard patch within 12 hours or as directed by MD 30 patch 0   NEURONTIN  800 MG tablet TAKE 1 TABLET BY MOUTH THREE TIMES A DAY 90 tablet 1   TEGRETOL -XR 200 MG 12 hr tablet Take 1 tablet (200 mg total) by mouth 2 (two) times daily. 60 tablet 11   TEGRETOL -XR 400 MG 12  hr tablet Take 1 tablet (400 mg total) by mouth 2 (two) times daily. 60 tablet 11   No facility-administered medications prior to visit.    PAST MEDICAL HISTORY: Past Medical History:  Diagnosis Date   Seizures (HCC)     PAST SURGICAL HISTORY: Past Surgical History:  Procedure Laterality Date   CERVICAL SPINE SURGERY     CHEST SURGERY     NECK SURGERY      FAMILY HISTORY: Family History  Problem Relation Age of Onset   Breast cancer Mother    Cancer Father     SOCIAL HISTORY: Social History   Socioeconomic History   Marital status: Single    Spouse name: Not on file   Number of children: Not on file   Years of education: Not on file   Highest education level: Not on file  Occupational History   Not on file  Tobacco Use   Smoking status: Never   Smokeless tobacco: Never  Vaping Use   Vaping status: Never Used  Substance and Sexual Activity   Alcohol use: No   Drug use: No   Sexual activity: Not on file  Other Topics Concern   Not on file  Social History Narrative   Patient lives at home with his parents.    Right handed   12 th grade   3 or more sodas daily   Social Drivers of Corporate investment banker Strain: Not on file  Food Insecurity: Not on file  Transportation Needs: Not on file  Physical Activity: Not on file  Stress: Not on file  Social Connections: Not on file  Intimate Partner Violence: Not on file   PHYSICAL EXAM  Vitals:   10/17/23 1102  BP: 136/78  Pulse: 80  Weight: 155 lb (70.3 kg)  Height: 5\' 7"  (1.702 m)   Body mass index is 24.28 kg/m.  Generalized: Well developed, in no acute distress  Neurological examination  Mentation: Alert oriented to time, place, history taking. Follows all commands speech and language fluent Cranial nerve II-XII: Pupils were equal round reactive to light. Extraocular movements were full, visual field were full on confrontational test. Facial sensation and strength were normal.  Head turning and  shoulder shrug  were normal and symmetric. Motor: The motor testing reveals 5 over 5 strength of all 4 extremities. Good symmetric motor tone is noted throughout.  Sensory: Sensory testing is intact to soft touch on all 4 extremities. No evidence of extinction is noted.  Coordination: Cerebellar testing reveals good finger-nose-finger and heel-to-shin bilaterally.  Gait and station: Antalgic gait, using a walker (due to recent back and thigh pain)   DIAGNOSTIC DATA (LABS, IMAGING, TESTING) - I reviewed patient records, labs, notes, testing and imaging myself where available.  Lab Results  Component Value Date   WBC 3.8 10/10/2022   HGB 16.3  10/10/2022   HCT 47.8 10/10/2022   MCV 88 10/10/2022   PLT 276 10/10/2022      Component Value Date/Time   NA 141 10/10/2022 1348   K 4.4 10/10/2022 1348   CL 101 10/10/2022 1348   CO2 25 10/10/2022 1348   GLUCOSE 89 10/10/2022 1348   GLUCOSE 118 (H) 10/13/2019 1530   BUN 9 10/10/2022 1348   CREATININE 0.78 10/10/2022 1348   CALCIUM 9.3 10/10/2022 1348   PROT 7.4 10/10/2022 1348   ALBUMIN 4.6 10/10/2022 1348   AST 19 10/10/2022 1348   ALT 16 10/10/2022 1348   ALKPHOS 103 10/10/2022 1348   BILITOT 0.3 10/10/2022 1348   GFRNONAA >60 10/13/2019 1530   GFRAA >60 10/13/2019 1530   No results found for: "CHOL", "HDL", "LDLCALC", "LDLDIRECT", "TRIG", "CHOLHDL" No results found for: "HGBA1C" Lab Results  Component Value Date   VITAMINB12 688 02/06/2006   No results found for: "TSH"   ASSESSMENT AND PLAN 53 y.o. year old male  has a past medical history of Seizures (HCC). here with:  1.  Seizure disorder, well-controlled  -He wishes to continue on current medication regimen, every year we have to work with his insurance to get approval for brand-name Neurontin  and Tegretol .  He indicates he has tried generic in the past and has had breakthrough seizures -Check routine labs today including vitamin D  -Last seizure was over 10 years  ago -Follow-up in 1 year or sooner if worse    Cassandra Cleveland, MD 10/17/2023, 11:33 AM [facility] [address] [place], [place] [address] [phone]

## 2023-10-17 NOTE — Patient Instructions (Signed)
 Continue current medications including Neurontin  brand-name 800 mg 3 times daily Continue Tegretol  XR brand-name 600 mg twice daily Will obtain antiseizure medication levels today with vitamin D  Return in 1 year, or sooner if worse

## 2023-10-19 LAB — COMPREHENSIVE METABOLIC PANEL WITH GFR
ALT: 22 IU/L (ref 0–44)
AST: 18 IU/L (ref 0–40)
Albumin: 4.7 g/dL (ref 3.8–4.9)
Alkaline Phosphatase: 99 IU/L (ref 44–121)
BUN/Creatinine Ratio: 13 (ref 9–20)
BUN: 10 mg/dL (ref 6–24)
Bilirubin Total: 0.5 mg/dL (ref 0.0–1.2)
CO2: 26 mmol/L (ref 20–29)
Calcium: 9.9 mg/dL (ref 8.7–10.2)
Chloride: 97 mmol/L (ref 96–106)
Creatinine, Ser: 0.79 mg/dL (ref 0.76–1.27)
Globulin, Total: 2.9 g/dL (ref 1.5–4.5)
Glucose: 90 mg/dL (ref 70–99)
Potassium: 4.8 mmol/L (ref 3.5–5.2)
Sodium: 138 mmol/L (ref 134–144)
Total Protein: 7.6 g/dL (ref 6.0–8.5)
eGFR: 108 mL/min/{1.73_m2} (ref >59)

## 2023-10-19 LAB — GABAPENTIN LEVEL: Gabapentin Lvl: 10.1 ug/mL (ref 4.0–16.0)

## 2023-10-19 LAB — CARBAMAZEPINE LEVEL, TOTAL: Carbamazepine (Tegretol), S: 12.8 ug/mL (ref 4.0–12.0)

## 2023-10-19 LAB — VITAMIN D 25 HYDROXY (VIT D DEFICIENCY, FRACTURES): Vit D, 25-Hydroxy: 20.4 ng/mL — ABNORMAL LOW (ref 30.0–100.0)

## 2023-10-22 ENCOUNTER — Other Ambulatory Visit: Payer: Self-pay | Admitting: Neurology

## 2023-10-22 MED ORDER — VITAMIN D (ERGOCALCIFEROL) 1.25 MG (50000 UNIT) PO CAPS: 50000.0000 [IU] | ORAL_CAPSULE | ORAL | 0 refills | Status: DC

## 2023-10-22 NOTE — Progress Notes (Signed)
 Please call and advise the patient that the recent labs we checked were within normal limits except for a low Vitamin D  level. We will start him on Vitamin D  supplement, to take once a week for the next 12 weeks. Please remind patient to keep any upcoming appointments or tests and to call us  with any interim questions, concerns, problems or updates. Thanks,   Cassandra Cleveland, MD

## 2023-11-13 ENCOUNTER — Other Ambulatory Visit: Payer: Self-pay | Admitting: Neurology

## 2023-12-09 ENCOUNTER — Other Ambulatory Visit: Payer: Self-pay | Admitting: Neurology

## 2024-01-11 ENCOUNTER — Other Ambulatory Visit: Payer: Self-pay | Admitting: Neurology

## 2024-02-10 ENCOUNTER — Other Ambulatory Visit: Payer: Self-pay | Admitting: Neurology

## 2024-02-11 ENCOUNTER — Other Ambulatory Visit: Payer: Self-pay | Admitting: Neurology

## 2024-02-19 ENCOUNTER — Other Ambulatory Visit: Payer: Self-pay | Admitting: Neurology

## 2024-02-19 ENCOUNTER — Telehealth: Payer: Self-pay | Admitting: Neurology

## 2024-02-19 MED ORDER — GABAPENTIN 800 MG PO TABS: 800.0000 mg | ORAL_TABLET | Freq: Three times a day (TID) | ORAL | 1 refills | Status: DC

## 2024-02-19 NOTE — Telephone Encounter (Signed)
 refilled

## 2024-02-19 NOTE — Telephone Encounter (Signed)
 Pt is requesting a refill for NEURONTIN  800 MG tablet .  Pharmacy: [facility] [phone]

## 2024-04-05 ENCOUNTER — Other Ambulatory Visit: Payer: Self-pay | Admitting: Neurology

## 2024-04-13 ENCOUNTER — Other Ambulatory Visit: Payer: Self-pay | Admitting: Neurology

## 2024-06-26 ENCOUNTER — Other Ambulatory Visit: Payer: Self-pay | Admitting: Neurology

## 2024-07-16 ENCOUNTER — Other Ambulatory Visit: Payer: Self-pay | Admitting: Neurology

## 2024-07-16 NOTE — Telephone Encounter (Signed)
 Refills

## 2024-10-16 ENCOUNTER — Ambulatory Visit: Admitting: Neurology
